# Patient Record
Sex: Female | Born: 1980 | Race: Black or African American | Hispanic: No | Marital: Married | Smoking: Former smoker
Health system: Southern US, Community
[De-identification: ages and names within clinical notes are randomized; demographics above are authoritative.]

## PROBLEM LIST (undated history)

## (undated) DIAGNOSIS — F329 Major depressive disorder, single episode, unspecified: Secondary | ICD-10-CM

## (undated) DIAGNOSIS — F419 Anxiety disorder, unspecified: Secondary | ICD-10-CM

## (undated) DIAGNOSIS — F32A Depression, unspecified: Secondary | ICD-10-CM

## (undated) DIAGNOSIS — G43909 Migraine, unspecified, not intractable, without status migrainosus: Secondary | ICD-10-CM

## (undated) HISTORY — PX: ENDOMETRIAL ABLATION: SHX621

## (undated) HISTORY — DX: Migraine, unspecified, not intractable, without status migrainosus: G43.909

## (undated) HISTORY — DX: Depression, unspecified: F32.A

## (undated) HISTORY — DX: Major depressive disorder, single episode, unspecified: F32.9

## (undated) HISTORY — DX: Anxiety disorder, unspecified: F41.9

---
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
## 2000-09-06 ENCOUNTER — Encounter: Payer: Self-pay | Admitting: Emergency Medicine

## 2000-09-06 ENCOUNTER — Emergency Department (HOSPITAL_COMMUNITY): Admission: EM | Admit: 2000-09-06 | Discharge: 2000-09-06 | Payer: Self-pay | Admitting: *Deleted

## 2000-09-06 ENCOUNTER — Encounter: Payer: Self-pay | Admitting: *Deleted

## 2000-09-30 ENCOUNTER — Ambulatory Visit (HOSPITAL_COMMUNITY): Admission: RE | Admit: 2000-09-30 | Discharge: 2000-09-30 | Payer: Self-pay | Admitting: Family Medicine

## 2000-09-30 ENCOUNTER — Encounter: Payer: Self-pay | Admitting: Family Medicine

## 2000-12-22 ENCOUNTER — Emergency Department (HOSPITAL_COMMUNITY): Admission: EM | Admit: 2000-12-22 | Discharge: 2000-12-22 | Payer: Self-pay | Admitting: Emergency Medicine

## 2001-07-15 ENCOUNTER — Inpatient Hospital Stay (HOSPITAL_COMMUNITY): Admission: AD | Admit: 2001-07-15 | Discharge: 2001-07-15 | Payer: Self-pay | Admitting: Obstetrics and Gynecology

## 2006-11-06 ENCOUNTER — Ambulatory Visit: Payer: Self-pay | Admitting: Obstetrics & Gynecology

## 2006-11-09 ENCOUNTER — Ambulatory Visit (HOSPITAL_COMMUNITY): Admission: RE | Admit: 2006-11-09 | Discharge: 2006-11-09 | Payer: Self-pay | Admitting: Family Medicine

## 2006-11-20 ENCOUNTER — Ambulatory Visit: Payer: Self-pay | Admitting: Obstetrics & Gynecology

## 2006-12-16 ENCOUNTER — Other Ambulatory Visit: Admission: RE | Admit: 2006-12-16 | Discharge: 2006-12-16 | Payer: Self-pay | Admitting: Obstetrics & Gynecology

## 2006-12-16 ENCOUNTER — Ambulatory Visit: Payer: Self-pay | Admitting: Gynecology

## 2006-12-30 ENCOUNTER — Ambulatory Visit: Payer: Self-pay | Admitting: Obstetrics & Gynecology

## 2007-01-31 ENCOUNTER — Ambulatory Visit: Payer: Self-pay | Admitting: Family Medicine

## 2007-01-31 ENCOUNTER — Observation Stay (HOSPITAL_COMMUNITY): Admission: EM | Admit: 2007-01-31 | Discharge: 2007-02-02 | Payer: Self-pay | Admitting: Emergency Medicine

## 2007-02-02 ENCOUNTER — Encounter: Payer: Self-pay | Admitting: Family Medicine

## 2007-02-04 ENCOUNTER — Encounter: Payer: Self-pay | Admitting: *Deleted

## 2007-02-04 ENCOUNTER — Telehealth (INDEPENDENT_AMBULATORY_CARE_PROVIDER_SITE_OTHER): Payer: Self-pay | Admitting: *Deleted

## 2007-02-04 ENCOUNTER — Ambulatory Visit: Payer: Self-pay | Admitting: Obstetrics & Gynecology

## 2007-02-08 ENCOUNTER — Ambulatory Visit: Payer: Self-pay | Admitting: Gynecology

## 2007-02-23 ENCOUNTER — Ambulatory Visit: Payer: Self-pay | Admitting: Family Medicine

## 2007-02-23 ENCOUNTER — Encounter: Payer: Self-pay | Admitting: Family Medicine

## 2007-02-23 DIAGNOSIS — F329 Major depressive disorder, single episode, unspecified: Secondary | ICD-10-CM

## 2007-02-23 LAB — CONVERTED CEMR LAB
AST: 17 units/L (ref 0–37)
Basophils Absolute: 0 10*3/uL (ref 0.0–0.1)
CO2: 22 meq/L (ref 19–32)
Creatinine, Ser: 0.91 mg/dL (ref 0.40–1.20)
HCT: 41 % (ref 36.0–46.0)
Lymphocytes Relative: 29 % (ref 12–46)
MCV: 88.6 fL (ref 78.0–100.0)
Monocytes Absolute: 0.5 10*3/uL (ref 0.1–1.0)
Monocytes Relative: 7 % (ref 3–12)
Platelets: 279 10*3/uL (ref 150–400)
RDW: 14.8 % (ref 11.5–15.5)
Sodium: 141 meq/L (ref 135–145)
TSH: 0.721 microintl units/mL (ref 0.350–5.50)
Total Bilirubin: 1.9 mg/dL — ABNORMAL HIGH (ref 0.3–1.2)
Total Protein: 8 g/dL (ref 6.0–8.3)

## 2007-03-17 ENCOUNTER — Other Ambulatory Visit: Admission: RE | Admit: 2007-03-17 | Discharge: 2007-03-17 | Payer: Self-pay | Admitting: Obstetrics & Gynecology

## 2007-03-17 ENCOUNTER — Ambulatory Visit: Payer: Self-pay | Admitting: Obstetrics & Gynecology

## 2007-04-27 ENCOUNTER — Ambulatory Visit (HOSPITAL_COMMUNITY): Admission: RE | Admit: 2007-04-27 | Discharge: 2007-04-27 | Payer: Self-pay | Admitting: Obstetrics & Gynecology

## 2007-04-27 ENCOUNTER — Encounter: Payer: Self-pay | Admitting: Obstetrics & Gynecology

## 2007-04-27 ENCOUNTER — Ambulatory Visit: Payer: Self-pay | Admitting: Obstetrics & Gynecology

## 2007-05-12 ENCOUNTER — Ambulatory Visit: Payer: Self-pay | Admitting: Obstetrics & Gynecology

## 2007-06-18 ENCOUNTER — Telehealth: Payer: Self-pay | Admitting: Family Medicine

## 2007-06-18 ENCOUNTER — Telehealth (INDEPENDENT_AMBULATORY_CARE_PROVIDER_SITE_OTHER): Payer: Self-pay | Admitting: Family Medicine

## 2007-06-18 ENCOUNTER — Emergency Department (HOSPITAL_COMMUNITY): Admission: EM | Admit: 2007-06-18 | Discharge: 2007-06-18 | Payer: Self-pay | Admitting: Family Medicine

## 2007-08-19 ENCOUNTER — Encounter: Payer: Self-pay | Admitting: *Deleted

## 2007-08-31 ENCOUNTER — Other Ambulatory Visit: Admission: RE | Admit: 2007-08-31 | Discharge: 2007-08-31 | Payer: Self-pay | Admitting: Family Medicine

## 2007-08-31 ENCOUNTER — Encounter: Payer: Self-pay | Admitting: Family Medicine

## 2007-08-31 ENCOUNTER — Ambulatory Visit: Payer: Self-pay | Admitting: Family Medicine

## 2007-08-31 LAB — CONVERTED CEMR LAB
Bilirubin Urine: NEGATIVE
Specific Gravity, Urine: 1.025
Urobilinogen, UA: 1
WBC, UA: >20 cells/hpf

## 2007-09-03 ENCOUNTER — Telehealth: Payer: Self-pay | Admitting: Family Medicine

## 2007-09-07 ENCOUNTER — Encounter: Payer: Self-pay | Admitting: Family Medicine

## 2007-10-13 ENCOUNTER — Ambulatory Visit: Payer: Self-pay | Admitting: Family Medicine

## 2007-10-13 ENCOUNTER — Encounter: Payer: Self-pay | Admitting: *Deleted

## 2007-10-13 ENCOUNTER — Telehealth: Payer: Self-pay | Admitting: *Deleted

## 2007-10-13 LAB — CONVERTED CEMR LAB

## 2007-11-11 ENCOUNTER — Telehealth: Payer: Self-pay | Admitting: *Deleted

## 2008-04-06 ENCOUNTER — Ambulatory Visit: Payer: Self-pay | Admitting: Family Medicine

## 2008-04-06 LAB — CONVERTED CEMR LAB
Beta hcg, urine, semiquantitative: NEGATIVE
Bilirubin Urine: NEGATIVE
Blood in Urine, dipstick: NEGATIVE
Glucose, Urine, Semiquant: NEGATIVE
Ketones, urine, test strip: NEGATIVE
Specific Gravity, Urine: 1.02
WBC Urine, dipstick: NEGATIVE

## 2008-04-12 ENCOUNTER — Encounter: Payer: Self-pay | Admitting: *Deleted

## 2008-05-16 ENCOUNTER — Ambulatory Visit: Payer: Self-pay | Admitting: Family Medicine

## 2008-05-16 LAB — CONVERTED CEMR LAB
Glucose, Urine, Semiquant: NEGATIVE
Ketones, urine, test strip: NEGATIVE

## 2008-06-08 ENCOUNTER — Encounter: Payer: Self-pay | Admitting: Family Medicine

## 2008-06-29 ENCOUNTER — Ambulatory Visit: Payer: Self-pay | Admitting: Family Medicine

## 2008-06-29 ENCOUNTER — Telehealth: Payer: Self-pay | Admitting: Family Medicine

## 2008-07-13 ENCOUNTER — Encounter: Payer: Self-pay | Admitting: Obstetrics & Gynecology

## 2008-07-13 ENCOUNTER — Ambulatory Visit: Payer: Self-pay | Admitting: Obstetrics & Gynecology

## 2008-07-13 ENCOUNTER — Encounter: Payer: Self-pay | Admitting: Family Medicine

## 2008-07-13 LAB — CONVERTED CEMR LAB
HCT: 37.5 % (ref 36.0–46.0)
MCHC: 33.9 g/dL (ref 30.0–36.0)
MCV: 89.3 fL (ref 78.0–100.0)
Platelets: 223 10*3/uL (ref 150–400)
RBC: 4.2 M/uL (ref 3.87–5.11)
WBC: 11.8 10*3/uL — ABNORMAL HIGH (ref 4.0–10.5)

## 2008-07-24 ENCOUNTER — Ambulatory Visit: Payer: Self-pay | Admitting: Family Medicine

## 2008-07-24 ENCOUNTER — Encounter: Payer: Self-pay | Admitting: Family Medicine

## 2008-07-26 LAB — CONVERTED CEMR LAB: TSH: 0.614 microintl units/mL (ref 0.350–4.500)

## 2008-08-28 ENCOUNTER — Encounter: Payer: Self-pay | Admitting: Family Medicine

## 2008-12-29 ENCOUNTER — Ambulatory Visit: Payer: Self-pay | Admitting: Family Medicine

## 2008-12-29 ENCOUNTER — Encounter: Payer: Self-pay | Admitting: Family Medicine

## 2008-12-29 ENCOUNTER — Other Ambulatory Visit: Admission: RE | Admit: 2008-12-29 | Discharge: 2008-12-29 | Payer: Self-pay | Admitting: Family Medicine

## 2008-12-29 LAB — CONVERTED CEMR LAB
Beta hcg, urine, semiquantitative: NEGATIVE
Bilirubin Urine: NEGATIVE
Nitrite: NEGATIVE
Protein, U semiquant: NEGATIVE
Specific Gravity, Urine: 1.015
WBC Urine, dipstick: NEGATIVE

## 2009-01-01 ENCOUNTER — Encounter: Payer: Self-pay | Admitting: Family Medicine

## 2009-01-01 LAB — CONVERTED CEMR LAB
HCT: 41.3 % (ref 36.0–46.0)
MCHC: 32.4 g/dL (ref 30.0–36.0)
RDW: 14.2 % (ref 11.5–15.5)
WBC: 9.5 10*3/uL (ref 4.0–10.5)

## 2009-01-03 ENCOUNTER — Encounter: Payer: Self-pay | Admitting: Family Medicine

## 2009-05-03 ENCOUNTER — Encounter: Payer: Self-pay | Admitting: Sports Medicine

## 2009-05-03 ENCOUNTER — Ambulatory Visit: Payer: Self-pay | Admitting: Family Medicine

## 2009-05-03 ENCOUNTER — Telehealth: Payer: Self-pay | Admitting: Family Medicine

## 2009-05-06 ENCOUNTER — Emergency Department (HOSPITAL_COMMUNITY): Admission: EM | Admit: 2009-05-06 | Discharge: 2009-05-06 | Payer: Self-pay | Admitting: Family Medicine

## 2009-07-21 IMAGING — US US TRANSVAGINAL NON-OB
1 series · 13 of 25 positions shown · non-contrast
Comparison: none

CLINICAL DATA: Evaluate for fibroids.  LMP 10/21/06.  
 TRANSABDOMINAL AND TRANSVAGINAL PELVIC ULTRASOUND:
TECHNIQUE: Both transabdominal and transvaginal ultrasound examinations of the pelvis were performed including evaluation of the uterus, ovaries, adnexal regions, and pelvic cul-de-sac.

[Series 1: us transvaginal non-ob · 0.26mm/px · 13 of 45 slices shown]
[im 1/45]
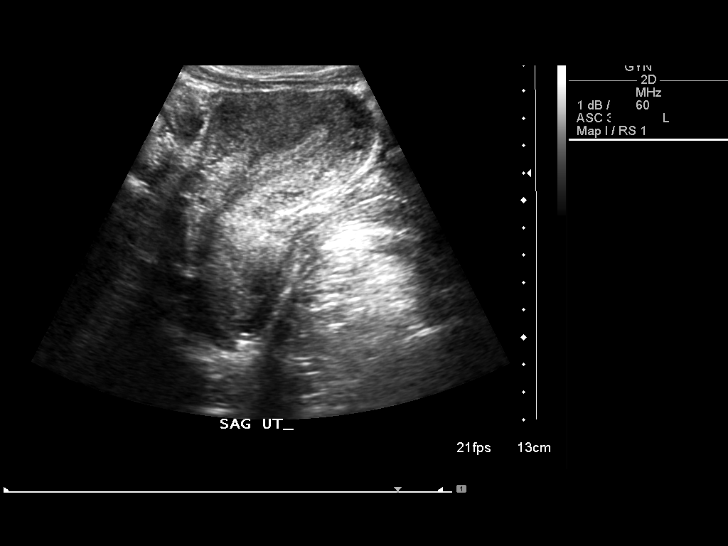
[im 4/45]
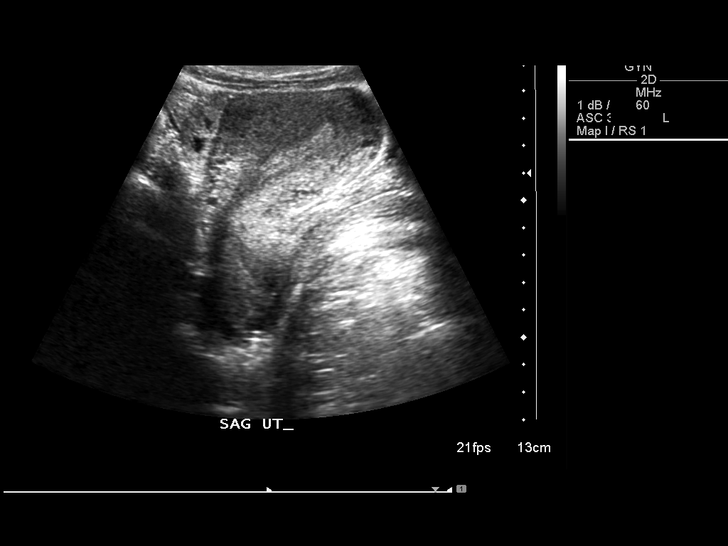
[im 8/45]
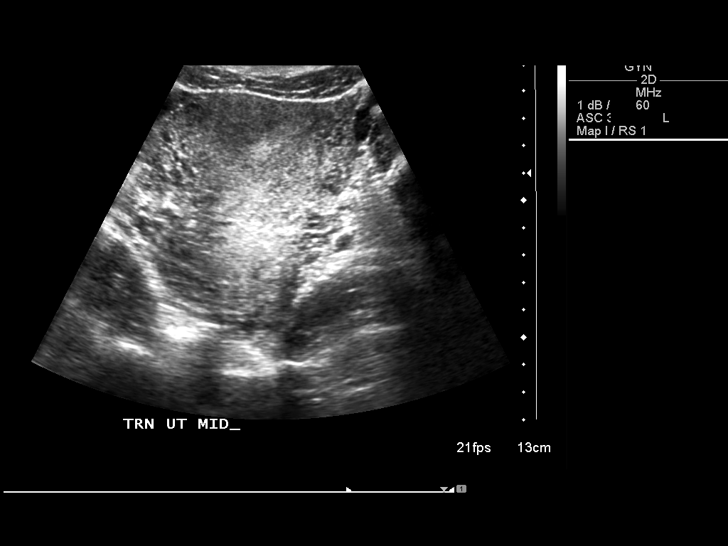
[im 12/45]
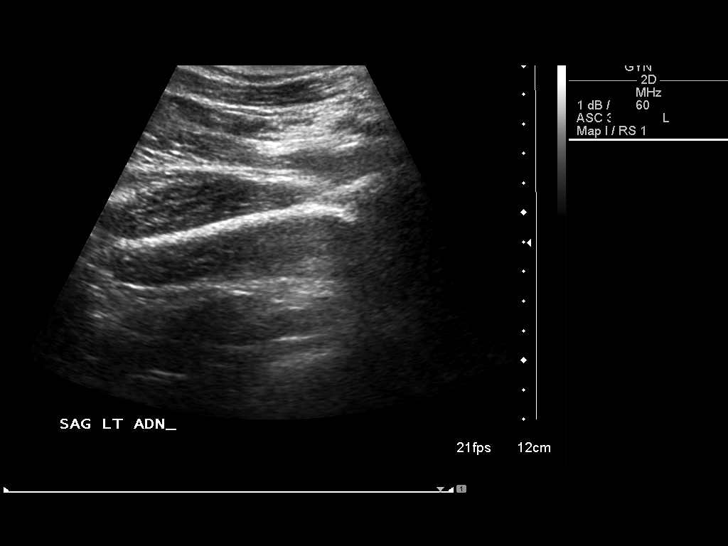
[im 15/45]
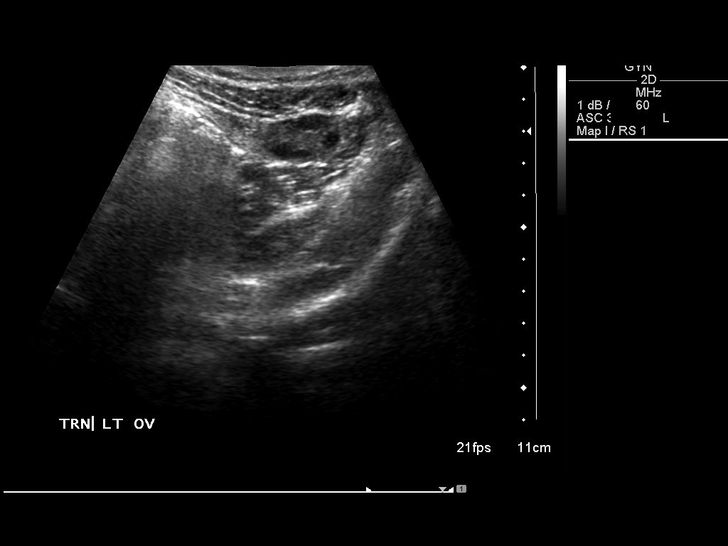
[im 19/45]
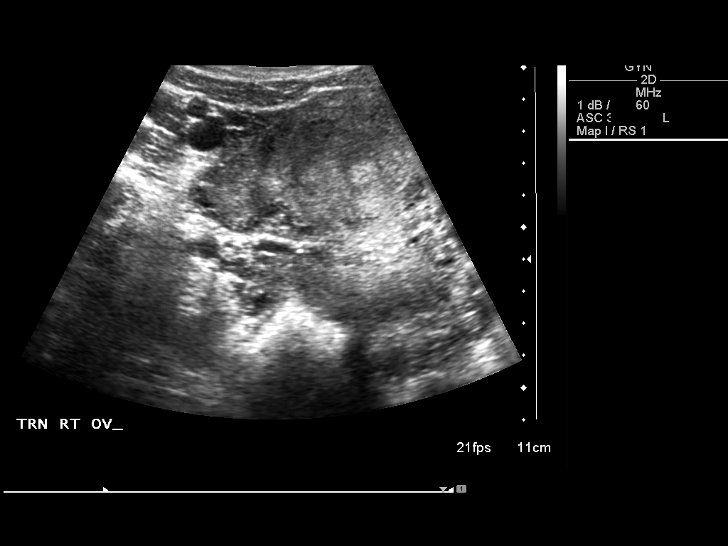
[im 23/45]
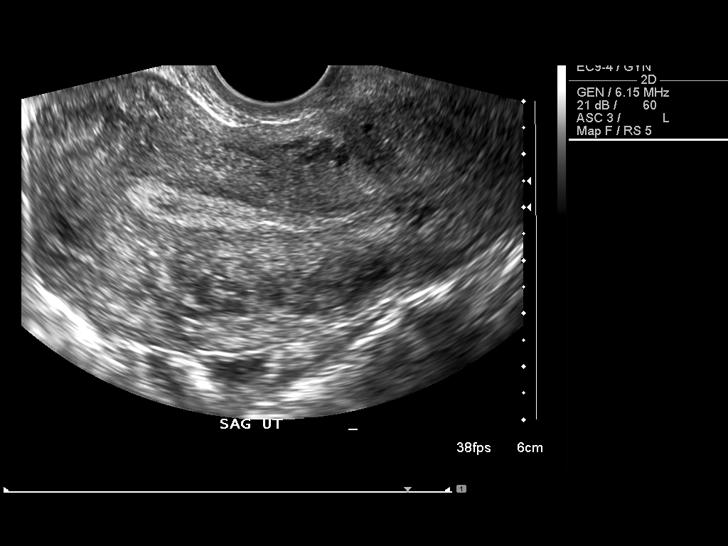
[im 26/45]
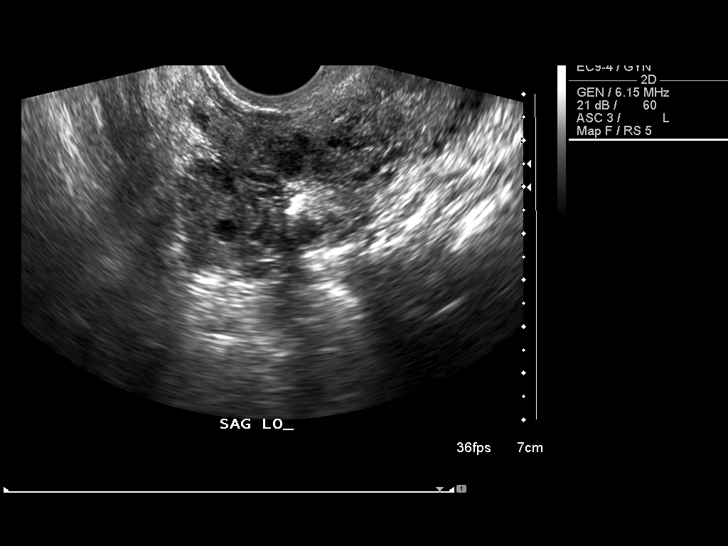
[im 30/45]
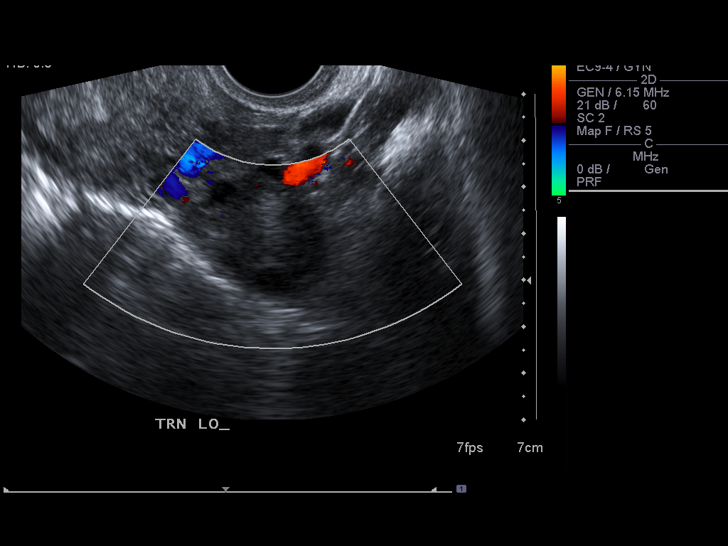
[im 34/45]
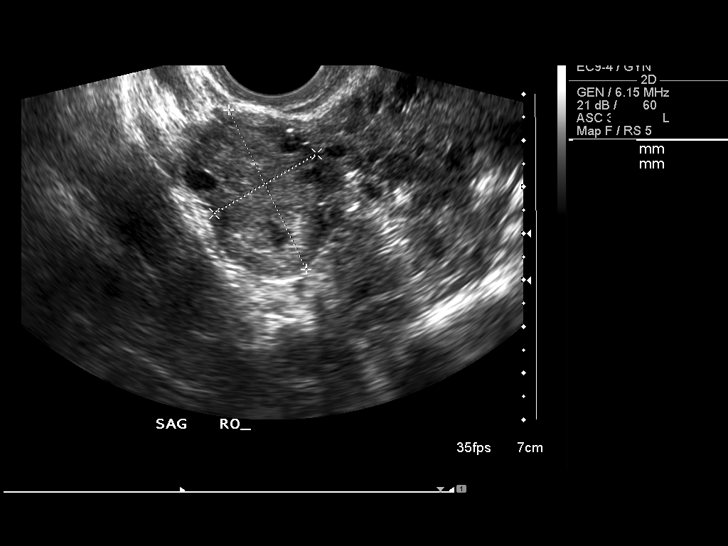
[im 37/45]
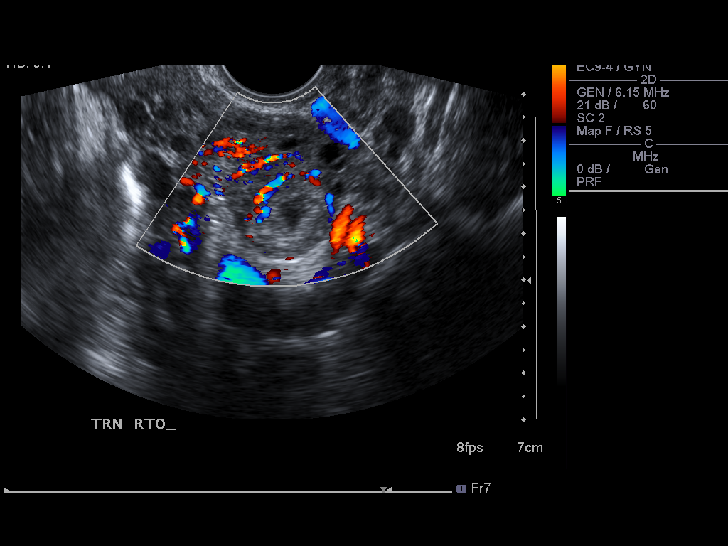
[im 41/45]
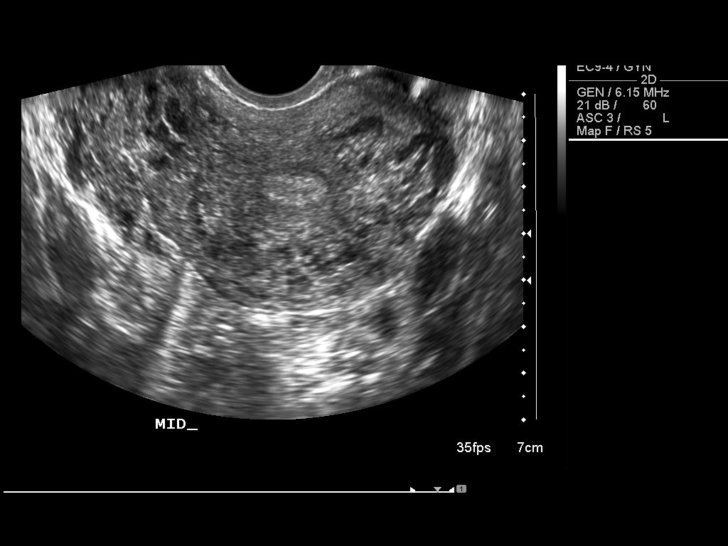
[im 45/45]
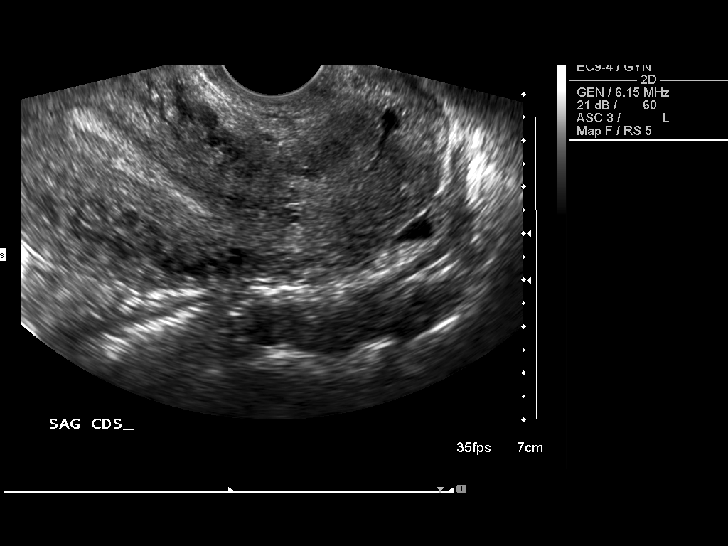

[13 of 25 positions shown; findings below may reference images not displayed]

FINDINGS: Multiple images of the uterus and adnexa were obtained using transabdominal and endovaginal approaches.  The uterus has a sagittal length of 11.6 cm and AP width of 5.3 cm and transverse width of 6.4 cm.  A homogeneous uterine myometrium is seen.  The endometrial stripe is tri-layered with an AP width of 6.3 mm and this would correlate with the periovulatory endometrial stripe and the patient?s given LMP of 10/21/06.  
 Both ovaries are seen with the left ovary  measuring 3.0 X 2.5 X 1.4 cm and the right ovary measuring 3.8 X 2.6 X 3.0 cm and containing a corpus luteum cyst.  A small amount of simple free fluid is identified in the cul-de-sac.  No adnexal masses are noted.
IMPRESSION: Normal periovulatory pelvic ultrasound.  The patient?s state a palpable abnormality was felt on physical exam.  If this persists, further evaluation with CT may be helpful to evaluate the nongynecologic structures of the pelvis.

## 2009-10-16 ENCOUNTER — Encounter: Payer: Self-pay | Admitting: *Deleted

## 2009-12-18 ENCOUNTER — Telehealth: Payer: Self-pay | Admitting: *Deleted

## 2009-12-19 ENCOUNTER — Encounter: Payer: Self-pay | Admitting: Family Medicine

## 2009-12-19 ENCOUNTER — Ambulatory Visit: Payer: Self-pay | Admitting: Family Medicine

## 2009-12-19 LAB — CONVERTED CEMR LAB
Hemoglobin: 13.2 g/dL (ref 12.0–15.0)
MCHC: 33 g/dL (ref 30.0–36.0)
Pap Smear: NEGATIVE
Platelets: 239 10*3/uL (ref 150–400)
RDW: 14 % (ref 11.5–15.5)
WBC: 10.4 10*3/uL (ref 4.0–10.5)

## 2009-12-21 ENCOUNTER — Telehealth: Payer: Self-pay | Admitting: *Deleted

## 2009-12-24 ENCOUNTER — Telehealth: Payer: Self-pay | Admitting: Family Medicine

## 2009-12-25 ENCOUNTER — Telehealth (INDEPENDENT_AMBULATORY_CARE_PROVIDER_SITE_OTHER): Payer: Self-pay | Admitting: Family Medicine

## 2010-01-31 ENCOUNTER — Encounter: Payer: Self-pay | Admitting: Family Medicine

## 2010-03-19 NOTE — Assessment & Plan Note (Signed)
Summary: boil?/Pearson/Linthavong   Vital Signs:  Patient profile:   30 year old female Height:      63 inches Weight:      134 pounds Temp:     98.0 degrees F oral Pulse rate:   85 / minute BP sitting:   112 / 80  (right arm) Cuff size:   regular  Vitals Entered By: Tessie Fass CMA (May 03, 2009 11:13 AM) CC: boil on lower back Is Patient Diabetic? No   Primary Care Provider:  Marisue Ivan, MD  CC:  boil on lower back.  History of Present Illness: 100F, boil on back for several days, tried to pop it, tried sitting in warm water.  Hurts and has been draining.  No fevers/chills.  Skin is very dry, tends to scratch back a lot.  Current Medications (verified): 1)  Zoloft 100 Mg Tabs (Sertraline Hcl) .... 1/2 Tablet By Mouth Daily For 1 Week and Then Stop 2)  Celexa 20 Mg Tabs (Citalopram Hydrobromide) .Marland Kitchen.. 1 Tab By Mouth Daily 3)  Detrol 1 Mg Tabs (Tolterodine Tartrate) .Marland Kitchen.. 1 Tab By Mouth Two Times A Day 4)  Doxycycline Hyclate 100 Mg Caps (Doxycycline Hyclate) .... One By Mouth Two Times A Day X 7 Days 5)  Tramadol Hcl 50 Mg Tabs (Tramadol Hcl) .... One Tab By Mouth Q6h As Needed For Pain  Allergies (verified): No Known Drug Allergies  Review of Systems       See HPI  Physical Exam  General:  Well-developed,well-nourished,in no acute distress; alert,appropriate and cooperative throughout examination Skin:  On back, 2 inches above gluteal crease, crusted, draining 2x2 cm boil.  Fluctuant, painful. Additional Exam:  I&D abscess  Consent obtained, time out conducted.  Area cleansed with alcohol, 10cc of lidocaine 1% with epi used to create a field block around abscess. Area further cleaned with chlorhexidine, draped in a sterile fashion.   Sterile gloves donned, Incision made approx 1.5 inches laterally across abscess with a #11 blade, immediate pus expressed.  Hemostat used to explore 4 quadrants, further pus expressed with minimal bleeding.  Pocket then packed with  approx 12" of 1/4" iodoform.  1 inch tail left.  Husband instructed on how to remove packing.  Dressings applied.   EBL minimal.  Pt stable.  Feels much better.   Impression & Recommendations:  Problem # 1:  BACK ABSCESS (ICD-682.2) Assessment New I&Ded, Doxy for 7 days as there were several other early abscesses present on her back and thigh that were not ready for I&D.  Handout and red flags given.  Tramadol for pain.  Her updated medication list for this problem includes:    Doxycycline Hyclate 100 Mg Caps (Doxycycline hyclate) ..... One by mouth two times a day x 7 days  Orders: [facility]- Est Level  3 ([address]782) I&D Abcess, simple- [facility] (10060)  Complete Medication List: 1)  Zoloft 100 Mg Tabs (Sertraline hcl) .... 1/2 tablet by mouth daily for 1 week and then stop 2)  Celexa 20 Mg Tabs (Citalopram hydrobromide) .Marland Kitchen.. 1 tab by mouth daily 3)  Detrol 1 Mg Tabs (Tolterodine tartrate) .Marland Kitchen.. 1 tab by mouth two times a day 4)  Doxycycline Hyclate 100 Mg Caps (Doxycycline hyclate) .... One by mouth two times a day x 7 days 5)  Tramadol Hcl 50 Mg Tabs (Tramadol hcl) .... One tab by mouth q6h as needed for pain  Patient Instructions: 1)  Pull out 6 inches of the packing material in 2 days, then pull  out hte rest 2 days later. 2)  Take the full course of doxycycline. 3)  Ultram for pain. 4)  -Dr. Karie Schwalbe. Prescriptions: TRAMADOL HCL 50 MG TABS (TRAMADOL HCL) One tab by mouth q6h as needed for pain  #40 x 0   Entered and Authorized by:   Rodney Langton MD   Signed by:   Rodney Langton MD on 05/03/2009   Method used:   Print then Give to Patient   RxID:   [id number] DOXYCYCLINE HYCLATE 100 MG CAPS (DOXYCYCLINE HYCLATE) One by mouth two times a day x 7 days  #14 x 0   Entered and Authorized by:   Rodney Langton MD   Signed by:   Rodney Langton MD on 05/03/2009   Method used:   Print then Give to Patient   RxID:   [id number]

## 2010-03-19 NOTE — Letter (Signed)
Summary: Handout Printed  Printed Handout:  - Incision and Drainage of Abscess 

## 2010-03-19 NOTE — Progress Notes (Signed)
Summary: WI request  Phone Note Call from Patient Call back at [phone]   Reason for Call: Talk to Nurse Summary of Call: requesting wi appt, has an infected bump on lower back Initial call taken by: Knox Royalty,  May 03, 2009 9:06 AM  Follow-up for Phone Call        it has been present x 1 wk. has applied hot compress .located "at bottom of my spine" painful when she applies pressure to it. relates hx of boils. to come in now  MD: plz discuss DNKAs  to Dennison Nancy, RN, Chiropodist. hx of multiple DNKAs Follow-up by: Golden Circle RN,  May 03, 2009 9:12 AM  Additional Follow-up for Phone Call Additional follow up Details #1::        pt seen in office by Dr. Karie Schwalbe, agree with plan Additional Follow-up by: Marisue Ivan  MD,  May 04, 2009 12:34 PM

## 2010-03-19 NOTE — Progress Notes (Signed)
Summary: Triage call  Phone Note Call from Patient   Caller: Patient Call For: [phone] Summary of Call: Pt had an ablation last year and was told she will no longer have a menstrual cycle.  Also had a tubal ligation in 05.  Pt have been experiencing vaginal bleeding x 4 days.  Is concerned.  Would like to have someone see her today.   Pt also advised to see Rudell Cobb for financial assistance.  Was instructed to not have anymore dnkas' since she received probation letter.  Pt understood and agreed to seek help from [employer]. Initial call taken by: Abundio Miu,  December 18, 2009 2:15 PM  Follow-up for Phone Call        pt returned call Follow-up by: De Nurse,  December 18, 2009 2:49 PM  Additional Follow-up for Phone Call Additional follow up Details #1::        Spoke with Dr Clotilde Dieter and Sheffield Slider.  They advised that pt be seen especially since she had no bleeding for almost a year and it now having it.  [place] appt made for tomorrow am. Additional Follow-up by: Dennison Nancy RN,  December 18, 2009 3:04 PM

## 2010-03-19 NOTE — Progress Notes (Signed)
  Phone Note Outgoing Call   Call placed by: Jimmy Footman, CMA,  December 25, 2009 10:32 AM Call placed to: Patient Summary of Call: LVM for pt to call back to inform of gyn appt. Info is on orders.  Follow-up for Phone Call        Spoke with and informed pt of gyn appt Follow-up by: Jimmy Footman, CMA,  December 25, 2009 3:59 PM

## 2010-03-19 NOTE — Assessment & Plan Note (Signed)
Summary: Vag bleeding/kf   Vital Signs:  Patient profile:   30 year old female Height:      63 inches Weight:      129 pounds BMI:     22.93 Temp:     97.5 degrees F oral Pulse rate:   69 / minute BP sitting:   108 / 74  (left arm) Cuff size:   regular  Vitals Entered By: Jimmy Footman, CMA (December 19, 2009 10:47 AM) CC: vaginal bleeding x4 days, started cramping last night, heavy to light bleeding Is Patient Diabetic? No Pain Assessment Patient in pain? yes     Location: vaginal cramping Type: aching   Primary Care Provider:  Jamie Brookes MD  CC:  vaginal bleeding x4 days, started cramping last night, and heavy to light bleeding.  History of Present Illness: pt s/p endometrial ablation for dysfunctional uterine bleeding approx 2 years ago. Has noticed abnormal bleeding for last 3-4 months. 1- 2episodes of bleeding per month. lasting 2-3 days-going through 5-6 pads  per day per pt. No NSAID, ASA use per pt. No vaginal dishcarge, recent irregular sexual activity. Minimal abd pain per pt. Pt unsure of pelvic imaging in the past.   Habits & Providers  Alcohol-Tobacco-Diet     Tobacco Status: never  Allergies: No Known Drug Allergies  Physical Exam  General:  alert and well-developed.   Neck:  supple and full ROM.   Lungs:  CTAB Heart:  RRR Abdomen:  + bowel sounds, minimal tenderness to palpation, no cervical motion tenderness Genitalia:  normal introitus and no external lesions.  + Blood in vagina and cervical os.    Impression & Recommendations:  Problem # 1:  ABNORMAL VAGINAL BLEEDING (ICD-626.9) Plan to re-refer to gyn for re-evaluation s/p endometrial ablation. Informed pt that endometrial ablation is not 100% effective in completely resolving dysfunctional uterine bleeding. Will rx provera in interim.  Will also obtain pap/cervical/vaginal cultures given hx/o STDs in past per pt as well as pt being due for annual screening. . Will also check CBC to rule out  anemia given bleeding history.  Her updated medication list for this problem includes:    Provera 10 Mg Tabs (Medroxyprogesterone acetate) .Marland Kitchen... 1 tablet daily for 10 days  Orders: U Preg-[facility] ([address]25) CBC-[facility] ([address]) [facility]- Est Level  3 (60454)  Complete Medication List: 1)  Zoloft 100 Mg Tabs (Sertraline hcl) .... 1/2 tablet by mouth daily for 1 week and then stop 2)  Celexa 20 Mg Tabs (Citalopram hydrobromide) .Marland Kitchen.. 1 tab by mouth daily 3)  Detrol 1 Mg Tabs (Tolterodine tartrate) .Marland Kitchen.. 1 tab by mouth two times a day 4)  Doxycycline Hyclate 100 Mg Caps (Doxycycline hyclate) .... One by mouth two times a day x 7 days 5)  Tramadol Hcl 50 Mg Tabs (Tramadol hcl) .... One tab by mouth q6h as needed for pain 6)  Provera 10 Mg Tabs (Medroxyprogesterone acetate) .Marland Kitchen.. 1 tablet daily for 10 days  [facility]: [phone]) GC/Chlamydia-[facility] (87591/87491) Pap Smear-[facility] ([id number])  Patient Instructions: 1)  It was good to see you today 2)  I am going to check some cultures today to evaluate your menstrual bleeding and pelvic pain  3)  I will also check some blood work to make sure that you are not anemic 4)  I am puttin gyou on provera for your menstrual bleeding 5)  I am also re-refering you back to gynecology  6)  If you develop any severe menstrual bleeding ,  abdominal pain, fever or any other symptoms, please give [place] a call or go to the ED for evaluation.  7)  Otherwise call for any questions 8)  God Bless,  9)  Doree Albee MD  Prescriptions: PROVERA 10 MG TABS (MEDROXYPROGESTERONE ACETATE) 1 tablet daily for 10 days  #10 x 0   Entered and Authorized by:   Doree Albee MD   Signed by:   Doree Albee MD on 12/20/2009   Method used:   Electronically to        [facility] [address] [phone]* (retail)       [address]       [place], [place]  [address]       Ph: [phone]       Fax: [phone]   RxID:   [id number]    Orders Added: 1)  U Preg-[facility] [81025] 2)  Wet  Prep- [facility] [87210] 3)  GC/Chlamydia-[facility] [87591/87491] 4)  CBC-[facility] [85027] 5)  Pap Smear-[facility] [[id number]] 6)  [facility]- Est Level  3 [17510]     Laboratory Results   Urine Tests  Date/Time Received: December 19, 2009 11:25 AM  Date/Time Reported: December 19, 2009 11:36 AM     Urine HCG: negative Comments: ...............test performed by......Marland KitchenBonnie A. [place], MLS (ASCP)cm  Date/Time Received: December 19, 2009 12:02 PM  Date/Time Reported: December 19, 2009 12:14 PM   Wet [place] Source: vag WBC/hpf: 0-2 Bacteria/hpf: 3+  Cocci Clue cells/hpf: moderate Yeast/hpf: none Trichomonas/hpf: none Comments: ...............test performed by......Marland KitchenBonnie A. [place], MLS (ASCP)cm     Appended Document: Vag bleeding/kf Called and discussed wet prep results w/ pt (+BV), as well as blood count. Will prescribe flagyl. Pt agreeable to plan.    Clinical Lists Changes  Medications: Added new medication of FLAGYL 500 MG TABS (METRONIDAZOLE) 1 tab by mouth two times a day for 7 days - Signed Rx of FLAGYL 500 MG TABS (METRONIDAZOLE) 1 tab by mouth two times a day for 7 days;  #14 x 0;  Signed;  Entered by: Doree Albee MD;  Authorized by: Doree Albee MD;  Method used: Electronically to [facility] [address] [phone]*, [address], [place], [place]  [address], Ph: [phone], Fax: [phone]    Prescriptions: FLAGYL 500 MG TABS (METRONIDAZOLE) 1 tab by mouth two times a day for 7 days  #14 x 0   Entered and Authorized by:   Doree Albee MD   Signed by:   Doree Albee MD on 12/20/2009   Method used:   Electronically to        [facility] [address] [phone]* (retail)       [address]       [place], [place]  [address]       Ph: [phone]       Fax: [phone]   RxID:   [id number]

## 2010-03-19 NOTE — Letter (Signed)
Summary: Probation Letter  [facility]  [address]   [place], [place] [address]   Phone: [phone]  Fax: [phone]    10/16/2009  Julie Hawkins [address] [place], [place]  [address]  Dear Ms. [facility],  With the goal of better serving all our patients the [facility] is following each patient's missed appointments.  You have missed at least 3 appointments with our practice.If you cannot keep your appointment, we expect you to call at least 24 hours before your appointment time.  Missing appointments prevents other patients from seeing [place] and makes it difficult to provide you with the best possible medical care.      1.   If you miss one more appointment, we will only give you limited medical services. This means we will not call in medication refills, complete a form, or make a referral for you except when you are here for a scheduled office visit.    2.   If you miss 2 or more appointments in the next year, we will dismiss you from our practice.    Our office staff can be reached at [phone] Monday through Friday from 8:30 a.m.-5:00 p.m. and will be glad to schedule your appointment as necessary.    Thank you.   [facility]  Appended Document: Probation Letter mailed

## 2010-03-19 NOTE — Progress Notes (Signed)
  Phone Note Outgoing Call   Call placed by: Jimmy Footman, CMA,  December 24, 2009 9:01 AM Call placed to: Patient Summary of Call: spoke with pt to give pap results. pt states that she was put on celexa and was wondering if she could get her rx refilled without having to come in due to her not having insurance. I explained to her that because it is antidepressive med she will have to probably come in to be seen. I also gave her info for [facility] again. Would she have to come in for visit before rx filled? Initial call taken by: Jimmy Footman, CMA,  December 24, 2009 9:03 AM  Follow-up for Phone Call        I would like her to come in. I dont' know how long she's been out, what her symptoms are like, is she on Zoloft or Celexa (both are on her med rec), and we need a current PHQ9 on her. It looks like it's been about 1 year since she was precribed this medication and she only had 1 refill so this is going to be like starting over again.  Follow-up by: Jamie Brookes MD,  December 24, 2009 11:14 AM  Additional Follow-up for Phone Call Additional follow up Details #1::        Spoke with pt and informed that if she is wanting to start meds again she will have to make appt. she agreed on calling  and making an appt Additional Follow-up by: Jimmy Footman, CMA,  December 24, 2009 11:34 AM

## 2010-03-19 NOTE — Miscellaneous (Signed)
  Clinical Lists Changes  Orders: Added new Referral order of Gynecologic Referral (Gyn) - Signed [phone] - Signed Added new Test order of GC/Chlamydia-[facility] (87591/87491) - Signed

## 2010-03-19 NOTE — Progress Notes (Signed)
  Phone Note Outgoing Call   Call placed by: Jimmy Footman, CMA,  December 21, 2009 10:46 AM Call placed to: Patient Summary of Call: LVM for pt to call back to inform of referral. [facility] and [facility] of [facility]t  Follow-up for Phone Call        spokw with patient and informed of [facility]. Pt also has an appt with Rudell Cobb for assistance prior to getting gyn done Follow-up by: Jimmy Footman, CMA,  December 21, 2009 3:02 PM

## 2010-03-20 ENCOUNTER — Encounter: Payer: Self-pay | Admitting: *Deleted

## 2010-03-21 NOTE — Miscellaneous (Signed)
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 Hospitalization 01/31/07-02/02/07 for attempted acetaminophen overdose 2. h/o HSV 2 2009- treated    (01/31/2010 9:36)       Past History:  Past Medical History: 1. Hospitalization 01/31/07-02/02/07 for attempted acetaminophen overdose 2. h/o HSV 2 2009- treated

## 2010-05-28 LAB — POCT URINALYSIS DIP (DEVICE)
Bilirubin Urine: NEGATIVE
Glucose, UA: NEGATIVE mg/dL
Nitrite: NEGATIVE
Urobilinogen, UA: 0.2 mg/dL (ref 0.0–1.0)

## 2010-06-28 ENCOUNTER — Ambulatory Visit (INDEPENDENT_AMBULATORY_CARE_PROVIDER_SITE_OTHER): Payer: Medicaid Other | Admitting: Family Medicine

## 2010-06-28 VITALS — BP 120/83 | HR 75 | Temp 97.6°F | Ht 63.5 in | Wt 136.4 lb

## 2010-06-28 DIAGNOSIS — N920 Excessive and frequent menstruation with regular cycle: Secondary | ICD-10-CM

## 2010-06-28 DIAGNOSIS — F329 Major depressive disorder, single episode, unspecified: Secondary | ICD-10-CM

## 2010-06-28 DIAGNOSIS — N898 Other specified noninflammatory disorders of vagina: Secondary | ICD-10-CM

## 2010-06-28 DIAGNOSIS — Z309 Encounter for contraceptive management, unspecified: Secondary | ICD-10-CM

## 2010-06-28 DIAGNOSIS — N939 Abnormal uterine and vaginal bleeding, unspecified: Secondary | ICD-10-CM | POA: Insufficient documentation

## 2010-06-28 MED ORDER — MEDROXYPROGESTERONE ACETATE 150 MG/ML IM SUSP
150.0000 mg | Freq: Once | INTRAMUSCULAR | Status: AC
  Administered 2010-06-28: 150 mg via INTRAMUSCULAR

## 2010-06-28 MED ORDER — TOLTERODINE TARTRATE 1 MG PO TABS: 1.0000 mg | ORAL_TABLET | Freq: Two times a day (BID) | ORAL | Status: DC

## 2010-06-28 MED ORDER — MEDROXYPROGESTERONE ACETATE 150 MG/ML IM SUSP: 150.0000 mg | INTRAMUSCULAR | Status: DC

## 2010-06-28 MED ORDER — CITALOPRAM HYDROBROMIDE 20 MG PO TABS: 40.0000 mg | ORAL_TABLET | Freq: Every day | ORAL | Status: DC

## 2010-06-28 NOTE — Progress Notes (Signed)
Vaginal Bleeding; Pt has had an ablation in the past and thought she was done with vaginal bleeding. She was offered Depo shots in the past and declined them but now wants to go ahead and get them to stop this bleeding. She is spending 20 dollars a month in pads.   Depression/Anxiety: Pt says that she use to be on a medication for anxiety and depression. She has been off her meds lately and would like to restart them. She is asking for Xanax here today but we discussed restarting her Celexa to treat both of them at the same time. She agrees.  ROS: neg except for vaginal bleeding.   PE:  Gen: NAD, sitting comfrotrtably.  CV: rrr, no murmur Pulm: CTAB, no wheezing.  Psych: Pt does appear calm and has good eye contact, slightly flat affect.

## 2010-06-28 NOTE — Patient Instructions (Signed)
You are filling out an anxiety and depression questionair today and should fill them out again in 4-6 weeks to see if things are getting better.  You need to be seen in about 4 weeks so we can talk about how the Celexa is helping with the anxiety and depression.   You are starting Depo today. You need to come back every 3 months for a nurse visit for depo shots. This should help with the bleeding.

## 2010-06-28 NOTE — Assessment & Plan Note (Addendum)
Pt has had ablation and now is starting to have bleeding and spotting throughout the month.  Upreg negative.  Started on Depo shots today per Pt request.

## 2010-06-28 NOTE — Assessment & Plan Note (Signed)
Pt continues to suffer with anxeity and depression.  PHQ9 score is 18 and "very difficult"  GAD7 score is 18 and "very difficult" putting her in the very severe category.  Pt restarted on Celexa today.

## 2010-07-02 ENCOUNTER — Telehealth: Payer: Self-pay | Admitting: *Deleted

## 2010-07-02 ENCOUNTER — Other Ambulatory Visit: Payer: Self-pay | Admitting: Family Medicine

## 2010-07-02 MED ORDER — SOLIFENACIN SUCCINATE 5 MG PO TABS: 5.0000 mg | ORAL_TABLET | Freq: Every day | ORAL | Status: DC

## 2010-07-02 NOTE — Group Therapy Note (Signed)
Julie Hawkins, BROERMAN NO.:  [id number]   MEDICAL RECORD NO.:  [id number]          PATIENT TYPE:  WOC   LOCATION:  [facility]                   FACILITY:  [facility]   PHYSICIAN:  Elsie Lincoln, MD      DATE OF BIRTH:  01-Jan-1981   DATE OF SERVICE:  07/13/2008                                  CLINIC NOTE   HISTORY:  The patient is a 30 year old female who is status post  endometrial ablation in March 2009.  She was amenorrhea for a while, but  she started to have her period again and less concerned.  I explained to  her that approximately 50% of the people are amenorrheic, but most more  like 85% are happy with their bleeding profile.  The patient only  changes pads 1-2 times a day.  It sounds like her periods are coming  once a month.  She just started bleeding today, and her period before  that was last month.  We will do a CBC today just to make sure that I am  not missing menorrhagia that does not appear to be heavy.  We also do a  urine pregnancy test.  Also of note, the patient has had cervical  dysplasia and underwent cryo.  I do not believe she has had adequate Pap  followup, so we will do a Pap smear today.  Also of note, during the  [place] required depression screen, she said that she was depressed and  sad.  She did not feel like life was worth living and sometimes she  wished she was dead.  She did not feel like ending her life.  I asked  her more in-depth questions about her depression, she said this is her  baseline, she does not feel like she wants to hurt herself or hurt  anyone else.  She appears appropriate today.   PAST FAMILY HISTORY:  The patient's mother was questionably newly  diagnosed with breast cancer.  The patient is going to ask her what kind  of cancer exactly her mother has, and if it is breast cancer, then the  mother should be tested for BRCA 1and 2.   PHYSICAL EXAMINATION:  GENERAL:  Well nourished, well developed, in no  apparent  distress.  HEENT:  Normocephalic and atraumatic.  CHEST:  Normal movement.  ABDOMEN:  Soft, nontender.  No organomegaly.  No hernia.  VITAL SIGNS:  Temperature 96.7, pulse 85, blood pressure 103/74, weight  136.7, and height 64 inches.  GENITALIA:  Tanner V.  Vagina, pink, normal rugae.  Bladder and urethra  nontender.  Cervix was closed.  Uterus anteverted, nontender, adnexa no  masses, nontender.  EXTREMITIES:  No edema.   ASSESSMENT AND PLAN:  A 30 year old female who is most likely have her  period.  1. The patient reassured that bleeding after an ablation can be      normal.  2. Urine pregnancy test.  3. CBC.  4. Pap smear for followup dysplasia.  5. No suicidal ideation today.  6. Return to clinic in 2-3 weeks.           ______________________________  Elsie Lincoln,  MD     KL/MEDQ  D:  07/13/2008  T:  07/14/2008  Job:  [id number]

## 2010-07-02 NOTE — Group Therapy Note (Signed)
NAMEMarland Kitchen  ATHA, MURADYAN NO.:  [id number]   MEDICAL RECORD NO.:  [id number]          PATIENT TYPE:  WOC   LOCATION:  [facility]                   FACILITY:  [facility]   PHYSICIAN:  Johnella Moloney, MD        DATE OF BIRTH:  [birth year]   DATE OF SERVICE:  03/17/2007                                  CLINIC NOTE   HISTORY OF PRESENT ILLNESS:  The patient is a 30 year old G2, P2 who is  here to follow her follow up of two reasons.  1. Cervical dysplasia.  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 After that she was seen in clinic and offered      cryotherapy versus LEEP. Patient preferred cryotherapy.  She missed      her subsequent appointment for this cryotherapy. Also the patient      is here today to have that cryotherapy done.  2. Her second complaint is of her menometrorrhagia.  The patient has      had a long history of menometrorrhagia for several years.  She has      used OCPs and Depo-Provera in the past and this has not alleviated      her symptoms. The patient is now requesting surgical evaluation and      management of this pain.  Her last ultrasound was in September      2008, which showed a normal pelvic ultrasound with no structural      anomalies seen and normal adnexa. The patient wants to discuss      further management of this problem.  The patient has no other      complaints.   REVIEW OF SYSTEMS:  A 14 point review of systems was done, but was  negative.   PAST MEDICAL HISTORY:  None.   PAST SURGICAL HISTORY:  Bilateral tubal ligation 3 years ago.   MEDICATIONS:  None.   ALLERGIES:  No known drug allergies.   PAST OB/GYN HISTORY:  The patient has a history of two vaginal  deliveries. She has a history of cervical dysplasia as noted and denies  any sexually transmitted infections.   SOCIAL HISTORY:  The patient does not smoke, use any drugs or drink any  alcohol.  She does not have any history of abuse.   FAMILY HISTORY:  There is a history of diabetes in her grandmother and  father and grandparents on both sides have heart disease. Her  grandmother also has high blood pressure.   PHYSICAL EXAMINATION:  Temperature 97.5, pulse 90, blood pressure  105/73.  Weight is 118.5 pounds, height is 63 inches.  GENERAL:  No apparent distress.  ABDOMEN:  Soft, nontender, nondistended.  PELVIC EXAM:  Normal external female genitalia with moderate amount of  blood noted in her vaginal with clots. Slow trickle blood noted from  cervical os.  No  uterine or cervical motion tenderness.  The patient  was noted to have a 10-week size introverted uterus.   PROCEDURES:  The patient was  counseled regarding the need for  endometrial biopsy given her persistent menometrorrhagia in addition to  her cryotherapy.  Her risks of the procedures were explained to the  patient and written informed consent was obtained. The patient's cervix  was swabbed with Betadine and was sounded to 10 cm.  A 3 mm Pipelle was  introduced to the patient's uterine cavity and a moderate amount of  tissue was obtained and sent to pathology. After this, her cervix was  cleansed and a cryotherapy probe was applied to this transformation  zone.  There were two cycles of cryotherapy 3 minutes each with a 2-  minute intervening rest in between cycles. Good ablation of  transformation zone was noted.  The patient tolerated both procedures  well.  She was told to take Motrin as needed for discomfort and a script  for ibuprofen was given to the patient.  She will follow up in 2 weeks  for results of the endometrial biopsy and also for further discussion.   ASSESSMENT/PLAN:  The patient is a 30 year old G2. P2 here for two  reasons.  1. Cervical dysplasia.  The patient is now status post cryotherapy.      She will need a follow-up Pap smear in 4-6 months. The patient was      told to expect  some discharge for several weeks which is normal      after cryotherapy.  2. Menometrorrhagia.  The patient has had persistent menometrorrhagia      for several months and desires surgical evaluation.  She is now      status post an endometrial biopsy. If this comes back negative, the      patient is interested in NovaSure endometrial ablation.  She does      understand that if the ablation does not alleviate her symptoms      that we will proceed with a hysterectomy. Patient is agreeable to      this plan.  She will be scheduled for an NovaSure endometrial      ablation and will be called with time and date of surgery.           ______________________________  Johnella Moloney, MD     UD/MEDQ  D:  03/17/2007  T:  03/17/2007  Job:  [id number]

## 2010-07-02 NOTE — Discharge Summary (Signed)
NAMEARDYCE, HEYER           ACCOUNT NO.:  [id number]   MEDICAL RECORD NO.:  [id number]          PATIENT TYPE:  WOC   LOCATION:  [facility]                          FACILITY:  [facility]   PHYSICIAN:  Marisue Ivan, MD  DATE OF BIRTH:  May 20, 1980   DATE OF ADMISSION:  01/31/2007  DATE OF DISCHARGE:  02/02/2007                               DISCHARGE SUMMARY   PRIMARY CARE PHYSICIAN:  Dr. Marisue Ivan.  This will be a new  patient of mine at [facility].   DISCHARGE DIAGNOSES:  1. Acetaminophen overdose.  2. Menorrhagia.  3. Depression.   DISCHARGE MEDICATIONS:  1. Zoloft 50 mg p.o. q.a.m.  2. Trazodone 50 mg p.o. q.h.s. for insomnia.   CONSULTATIONS:  Psychiatry, Dr. Jeanie Sewer.   PROCEDURES:  The patient had a pelvic ultrasound performed on December  15 that was normal.   LABORATORY DATA:  Upon admission, the patient had a white blood cell  count of 7.6, hemoglobin 13.5, AST 18, ALT 11, T-bilirubin 1.8, alk-phos  50, lipase 22.  Had a normal alcohol level, normal aspirin level,  acetaminophen level of 38.6, 13 hours post last oral intake.  Urine drug  screen was negative.  UA showed large blood, urine pregnancy was  negative.  Chest x-ray was negative.  D. dimer was also negative.   BRIEF HOSPITAL COURSE:  This is a 30 year old that was admitted for  acetaminophen overdose.   PROBLEMS:  1. Acetaminophen overdose.  Patient admitted to taking up to 16      Tylenol PM's within an 11-hour period, but her story seems to      change with different people.  She admits that this was an attempt      to hurt herself.  It was a suicide attempt.  While in the ED, she      was started on anacetalcystine for 24 hours.  She was brought up to      the floor and was unable to see by ACT.  While on the floor, she      remained alert and oriented x3.  She no longer had any suicidal or      homicidal ideations.  She was not having any visual or auditory  hallucinations.  She did seem to have a flat affect, and she did      admit to being depressed.  The patient was pain free during      hospitalization.  We immediately called for a psych consult and      placed her under observation of a sitter.  Dr. Jeanie Sewer did come      by and recommended that she be started on a mood stabilizer of      Depakote 500 q.h.s., also on SSRI Zoloft 50 mg and also on      Trazodone 50 mg to help her with sleep.  At this time, as her      primary care physician, I decided not to continue the Depakote but      to start her on Zoloft and the Trazodone and to arrange close  follow up.  2. Menorrhagia.  She has been followed at [facility] for several      months and has been known to have spotted bleeding in between      periods.  While in the hospital, she was started on Provera 10 mg,      and she is not going to be discharged on that medication, because      she will have follow up with the [facility] this week.  Her      hemoglobin was 13.5, so she was not in any kind of acute anemia due      to her bleed.   DISCHARGE INSTRUCTIONS:  She is to return to work on February 05, 2007.  She has no instructions on diet or activity.  She needs to call her  primary care doctor or return to the ER if she has any intentions of  hurting herself or others.  Also, if she has bad nausea, vomiting or any  drastic abnormal symptoms or reactions to her new medications.   FOLLOWUP:  She needs to follow up at [facility], phone  [phone] on December 18 at 10:30 a.m.  A follow up appointment needs to  be set up with Dr. Marisue Ivan.   CONDITION ON DISCHARGE:  She is in stable condition.      Marisue Ivan, MD  Electronically Signed     KL/MEDQ  D:  02/02/2007  T:  02/02/2007  Job:  [id number]
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
## 2010-07-02 NOTE — Op Note (Signed)
[name], Julie Hawkins           ACCOUNT NO.:  [id number]   MEDICAL RECORD NO.:  [id number]           PATIENT TYPE:   LOCATION:                                [facility]   PHYSICIAN:  Norton Blizzard, MD    DATE OF BIRTH:  [birth year]   DATE OF PROCEDURE:  04/27/2007  DATE OF DISCHARGE:  04/27/2007                               OPERATIVE REPORT   PREOPERATIVE DIAGNOSIS:  Menometrorrhagia.   POSTOPERATIVE DIAGNOSIS:  Menometrorrhagia.   PROCEDURE:  Dilation and curettage, NovaSure endometrial ablation.   SURGEON:  Norton Blizzard, MD.   ANESTHESIA:  General, paracervical block with 25% Marcaine with  epinephrine 20 ml.   ESTIMATED BLOOD LOSS:  Minimal.   INDICATIONS:  The patient is a 30 year old, G2, P2, with a long history  of menometrorrhagia.  She had used OCPs and Depo-Provera in the past and  was desiring endometrial ablation.  The risks and benefits of the  procedure were explained to the patient and written informed consent was  obtained on the day of surgery.  Of note, the patient has had a  bilateral tubal ligation three years ago and was not interested in  further childbearing.   FINDINGS:  A 10-week size anteverted uterus, moderate amount of  endometrial curettings.  The uterus sounded to 10 cm, the cervical  length was noted to be 5 cm and a cavity length of 5 cm, cavity width  was noted to be 3.7 cm.  Total ablation time was 78 seconds.   SPECIMENS:  Endometrial curettings sent to Pathology.   COMPLICATIONS:  None.   PROCEDURE DETAILS:  The patient was taken to the operating room where  general anesthesia was placed and found to be adequate.  She was then  prepped and draped in a sterile manner after being placed in a dorsal  lithotomy position.  Patient's bladder was catheterized and measured a  normal amount of clear, yellow urine.  A speculum was placed in the  patient's vagina, and the anterior lip of the cervix was grasped with a  tenaculum.  The  cervix, then the uterus, was sounded, the lengths were  obtained as above.  The uterus was then dilated to accommodate the  curette, which was used to perform a sharp curettage that yielded  moderate endometrial curettings.  After the curettage, a NovaSure  apparatus was then placed in the patient's uterine cavity.  A seeding  procedure was done, which yielded a cavity width of 3.7 cm.  Cavity  integrity was assured that the ablation procedure commenced and was done  without any complications.  Total ablation time was noted to be 78  seconds.  The apparatus was removed, overall good  hemostasis was noted, and all instruments were removed from the  patient's vagina.  The patient tolerated the procedure well.  Sponge and  instrument counts were correct x2.  She was taken to the recovery room,  extubated, awake, and in stable condition.      Norton Blizzard, MD  Electronically Signed     UAD/MEDQ  D:  04/27/2007  T:  04/27/2007  Job:  [id number]

## 2010-07-02 NOTE — Telephone Encounter (Signed)
Called patient and explained to her detrol not covered and vesicare prescribed and sent [facility].Kaiyon Hynes, Roselyn Meier

## 2010-07-02 NOTE — Consult Note (Signed)
Julie Hawkins NO.:  [id number]   MEDICAL RECORD NO.:  [id number]          PATIENT TYPE:  WOC   LOCATION:  WOC                          FACILITY:  [facility]   PHYSICIAN:  Antonietta Breach, M.D.  DATE OF BIRTH:  May 08, 1980   DATE OF CONSULTATION:  02/02/2007  DATE OF DISCHARGE:                                 CONSULTATION   REASON FOR CONSULTATION:  Overdose.   HISTORY OF PRESENT ILLNESS:  [name] is a 30 year old  female admitted to the [facility] on January 31, 2007, after an  overdose.   Upon the initial general medical evaluation, the patient initially  denied suicide attempt.  She then opened up and stated that she was  trying to kill herself when she took approximately 6 grams of Tylenol PM  over several hours.   Since that time, she has consistently denied suicidal thoughts.  She has  been socially appropriate and cooperative with staff.  Progress notes in  the chart show consisted absence of suicidal thoughts.  The patient  describes obstructive future goals and interests.  She is not having any  thoughts of harming others.  She has no delusions or hallucinations.   She describes interest in her children.  She has children that are under  the care at this time of her mother.   The patient has no racing thoughts   She did experience a short period of depressed mood lasting  approximately 2 days in response to a number of stresses including  marital discord.  She states that she and her husband are separated at  this time.  However, she is tolerating the experience better now.  She  has a determination to make the best of it, regardless of how the  relationship works out.   PAST PSYCHIATRIC HISTORY:  The patient gave the undersigned permission  to talk to her social worker at the [employer].  The undersigned  called Herbert Deaner.  Vikki Ports confirmed that the patient does have a  history of bipolar disorder.  She has been  on Depakote and Zoloft.  The  patient was not taking these at the time of the admission.   The psychiatric appointment for the patient at the [facility] is pending.   FAMILY PSYCHIATRIC HISTORY:  None known.   SOCIAL HISTORY:  The patient is currently separated from her husband and  living with her mother.  She has two children.  She works at [employer].  She does have a history of using marijuana, not alcohol.   PAST MEDICAL HISTORY:  1. Status post Tylenol PM overdose with medical clearance at this      time.  2. The patient does have a history of menorrhagia and has been      followed by the [facility].  They have      assessed uterine fibroids.   MEDICATIONS:  MAR is reviewed.  The patient is not on any psychotropics  at this time.  She did receive a one-time dose of acetylcysteine as a  Tylenol toxicity antidote.  ALLERGIES:  NO KNOWN DRUG ALLERGIES.   LABORATORY DATA:  Hepatic function panel shows SGOT 14, SGPT 9, albumin  3.1.  Urine HCG negative.  Drug screen is negative.  Aspirin negative.  Lipase normal.  Alcohol negative.  Initial acetaminophen level 38.6   WBC 7.6, hemoglobin 13.8, platelet count 256.   REVIEW OF SYSTEMS:  CONSTITUTIONAL:  Afebrile.  No weight loss.  HEAD:  No trauma.  EYES:  No visual changes.  EARS:  No hearing impairment.  NOSE:  No rhinorrhea.  MOUTH/THROAT:  No sore throat.  NEUROLOGIC:  No  focal motor or sensory changes.  PSYCHIATRIC:  As above.  The patient  has no history of suicide attempts.  CARDIOVASCULAR:  No chest pain,  palpitations.  RESPIRATORY:  No coughing or wheezing.  GASTROINTESTINAL:  No nausea, vomiting, diarrhea.  GENITOURINARY:  No dysuria.  SKIN:  Unremarkable.  ENDOCRINE/METABOLIC:  No heat or cold intolerance.  MUSCULOSKELETAL:  No deformities.  HEMATOLOGIC/LYMPHATIC:  Unremarkable.   PHYSICAL EXAMINATION:  VITAL SIGNS:  Temperature 97.4, pulse 84,  respiratory rate  18, blood pressure 99/65, O2 saturation on room air  100%.  GENERAL APPEARANCE:  Julie Hawkins is a young female sitting up in her  hospital bed without any abnormal involuntary movements.  OTHER MENTAL STATUS EXAM:  Julie Hawkins is alert.  Her eye contact is  good.  Her attention span is within normal limits.  Her concentration is  within normal limits.  She is oriented to all spheres.  Her memory is  intact to immediate recent and remote.  Her fund of knowledge and  intelligence are within normal limits.  Her speech involves normal rate  and prosody without dysarthria.   Thought process:  Logical, coherent, goal-directed.  No looseness of  associations.  Language expression and comprehension are intact.  Abstraction intact.  Thought content:  No thoughts of harming herself,  no thoughts of harming others.  No delusions, no hallucinations.  Insight is good.  Judgment is intact.   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  Rule out 296.80 bipolar disorder not otherwise specified.  Rule out adjustment disorder with mixed disturbance of emotions and  conduct.  Cannabis abuse.  AXIS II:  Deferred.  AXIS III:  See general medical history above.  AXIS IV:  Marital primary support group.  AXIS V:  55.   Julie Hawkins is no longer at risk to harm herself after having time in  the hospital to recover from her acute emotional reaction to marital  discord.  She agrees to call the emergency services immediately for any  thoughts of harming herself, thoughts of harming others or other  psychiatric emergency symptoms.   Because the patient does have a history of bipolar disorder, she will be  restarted on her primary mood stabilizer Depakote.  She will also be  restarted on her Zoloft for antidepression.   RECOMMENDATIONS:  Would restart Zoloft 250 mg q.a.m. for antidepression.  Would also restart Depakote at 500 mg q.h.s. as her mood stabilizer, and  increase the  Depakote to 1000 mg ER q.h.s. as tolerated.   Would check a CBC and liver function panel within the first week and  periodically thereafter to screen for Depakote adverse effects.   The patient has follow-up with a therapist.  She will also have follow-  up at the [facility] for psychotropic  medication management.  She will continue to work with her social  worker  at [employer].   The undersigned did recommend psychiatric inpatient hospitalization due  to the fact that the patient had been on psychotropic medications and  yet did have a severe depressive swing which resulted in self-harm.  The  inpatient psychiatric unit would provide the fastest way to get into  ongoing and further psychiatric evaluation and treatment.  However, the  patient declined, and she is not committable now that she has recovered  from her acute destructive emotional state.   The patient is motivated for outpatient care.   Additional medication recommendations:  As discussed with the patient,  in addition to the above, trazodone can be added at 25-50 mg q.h.s. for  insomnia and avoid the use of Tylenol PM or potentially addictive  medications.      Antonietta Breach, M.D.  Electronically Signed     JW/MEDQ  D:  02/02/2007  T:  02/03/2007  Job:  [id number]

## 2010-09-02 ENCOUNTER — Telehealth: Payer: Self-pay | Admitting: Family Medicine

## 2010-09-02 NOTE — Telephone Encounter (Signed)
Patient reports she had a tooth pulled last week and since she has been on antibiotic she now has a vaginal discharge that is greenish in color, appointment scheduled for tomorrow AM

## 2010-09-02 NOTE — Telephone Encounter (Signed)
Has a question about her depo and menses.  Also has been taking abx and thinks she has a yeast inf.  Wants to talk to nurse

## 2010-09-03 ENCOUNTER — Ambulatory Visit (INDEPENDENT_AMBULATORY_CARE_PROVIDER_SITE_OTHER): Payer: Medicaid Other | Admitting: Family Medicine

## 2010-09-03 DIAGNOSIS — H0520 Unspecified exophthalmos: Secondary | ICD-10-CM | POA: Insufficient documentation

## 2010-09-03 DIAGNOSIS — N760 Acute vaginitis: Secondary | ICD-10-CM

## 2010-09-03 DIAGNOSIS — N898 Other specified noninflammatory disorders of vagina: Secondary | ICD-10-CM

## 2010-09-03 DIAGNOSIS — A5901 Trichomonal vulvovaginitis: Secondary | ICD-10-CM | POA: Insufficient documentation

## 2010-09-03 LAB — POCT WET PREP (WET MOUNT): Clue Cells Wet Prep HPF POC: NEGATIVE

## 2010-09-03 LAB — TSH: TSH: 0.994 u[IU]/mL (ref 0.350–4.500)

## 2010-09-03 MED ORDER — METRONIDAZOLE 500 MG PO TABS: 500.0000 mg | ORAL_TABLET | Freq: Two times a day (BID) | ORAL | Status: AC

## 2010-09-03 NOTE — Assessment & Plan Note (Signed)
Noted incidently on exam. Not any real ROS or other exam indicators for hyperthyroid. Will obtain TSH to follow to check for hyperthyroid.

## 2010-09-03 NOTE — Assessment & Plan Note (Signed)
Based on trich seen on wet prep.  Plan treat with flagyl x 1 week. Discussed what Ivery Quale is an how one gets it.  Partner will need treatment and pt should use condoms until both partners get treatment.  Will f/u if not improved.

## 2010-09-03 NOTE — Patient Instructions (Signed)
Thank you for coming in today. You have Trichomoniasis. See the handout.  You and your partner should both be treated.  Use condoms until both you and your partner have completed a course of therapy.  If you do not feel better after a week or so come back.  I will call you about your thyroid test.

## 2010-09-03 NOTE — Progress Notes (Signed)
Pt notes thin white vaginal discharge starting 2 weeks ago with the start of an antibiotic course for a dental abscess. She notes vaginal itching and tried an OTC yeast infection treatment which she thinks did not help and perhaps made her worse. She denies any abdominal pain or dysurea. She feels pretty well overall.   ROS: Notes feeling hot and then cold a lot. No hair loss or skin changes. No palpitations.   Exam:  Vs noted.  Gen: Well NAD HEENT: EOMI, PERRL, MMM. Sclera visible above and below iris in both eyes. No neck mass.  Lungs: CTABL Nl WOB Heart: RRR no MRG Abd: NABS, NT, ND Exts: Non edematous BL  LE Vagina: Normal external genitalia. Thick yellow/white vaginal discharge present. Cervix normal and not tender.

## 2010-09-04 ENCOUNTER — Encounter: Payer: Self-pay | Admitting: Family Medicine

## 2010-09-13 ENCOUNTER — Ambulatory Visit: Payer: Medicaid Other

## 2010-09-17 ENCOUNTER — Ambulatory Visit: Payer: Medicaid Other

## 2010-09-19 ENCOUNTER — Ambulatory Visit (INDEPENDENT_AMBULATORY_CARE_PROVIDER_SITE_OTHER): Payer: Medicaid Other | Admitting: *Deleted

## 2010-09-19 DIAGNOSIS — Z309 Encounter for contraceptive management, unspecified: Secondary | ICD-10-CM

## 2010-09-19 MED ORDER — MEDROXYPROGESTERONE ACETATE 150 MG/ML IM SUSP
150.0000 mg | Freq: Once | INTRAMUSCULAR | Status: AC
  Administered 2010-09-19: 150 mg via INTRAMUSCULAR

## 2010-11-11 LAB — CBC
RDW: 14.2
WBC: 10.7 — ABNORMAL HIGH

## 2010-11-11 LAB — HCG, SERUM, QUALITATIVE: Preg, Serum: NEGATIVE

## 2010-11-25 LAB — URINE MICROSCOPIC-ADD ON

## 2010-11-25 LAB — URINALYSIS, ROUTINE W REFLEX MICROSCOPIC
Nitrite: NEGATIVE
Protein, ur: 30 — AB
Specific Gravity, Urine: 1.043 — ABNORMAL HIGH
Urobilinogen, UA: 1

## 2010-11-25 LAB — HEPATIC FUNCTION PANEL
AST: 14
AST: 18
Albumin: 3.1 — ABNORMAL LOW
Alkaline Phosphatase: 43
Bilirubin, Direct: 0.2
Indirect Bilirubin: 1.3 — ABNORMAL HIGH
Indirect Bilirubin: 1.6 — ABNORMAL HIGH
Total Bilirubin: 1.5 — ABNORMAL HIGH
Total Bilirubin: 1.8 — ABNORMAL HIGH

## 2010-11-25 LAB — COMPREHENSIVE METABOLIC PANEL
ALT: 10
Albumin: 4
Alkaline Phosphatase: 50
BUN: 6
Calcium: 9.8
Glucose, Bld: 85
Potassium: 3.7
Sodium: 142
Total Protein: 7.8

## 2010-11-25 LAB — RAPID URINE DRUG SCREEN, HOSP PERFORMED
Amphetamines: NOT DETECTED
Benzodiazepines: NOT DETECTED
Cocaine: NOT DETECTED
Opiates: NOT DETECTED
Tetrahydrocannabinol: NOT DETECTED

## 2010-11-25 LAB — CBC
MCV: 87.2
Platelets: 256
WBC: 7.6

## 2010-11-25 LAB — DIFFERENTIAL
Basophils Relative: 0
Lymphs Abs: 2.2
Monocytes Absolute: 0.5
Monocytes Relative: 7
Neutro Abs: 4.8
Neutrophils Relative %: 63

## 2010-11-25 LAB — LIPASE, BLOOD: Lipase: 22

## 2010-11-25 LAB — ETHANOL: Alcohol, Ethyl (B): <5

## 2010-11-25 LAB — D-DIMER, QUANTITATIVE: D-Dimer, Quant: <0.22

## 2010-12-20 ENCOUNTER — Ambulatory Visit (INDEPENDENT_AMBULATORY_CARE_PROVIDER_SITE_OTHER): Payer: Medicaid Other | Admitting: *Deleted

## 2010-12-20 DIAGNOSIS — Z309 Encounter for contraceptive management, unspecified: Secondary | ICD-10-CM

## 2010-12-20 LAB — POCT URINE PREGNANCY: Preg Test, Ur: NEGATIVE

## 2010-12-20 MED ORDER — MEDROXYPROGESTERONE ACETATE 150 MG/ML IM SUSP
150.0000 mg | Freq: Once | INTRAMUSCULAR | Status: AC
  Administered 2010-12-20: 150 mg via INTRAMUSCULAR

## 2010-12-26 ENCOUNTER — Encounter: Payer: Medicaid Other | Admitting: Family Medicine

## 2011-01-15 ENCOUNTER — Other Ambulatory Visit (HOSPITAL_COMMUNITY)
Admission: RE | Admit: 2011-01-15 | Discharge: 2011-01-15 | Disposition: A | Discharge status: Home / Self Care | Payer: Medicaid Other | Source: Ambulatory Visit | Attending: Family Medicine | Admitting: Family Medicine

## 2011-01-15 ENCOUNTER — Encounter: Payer: Self-pay | Admitting: Family Medicine

## 2011-01-15 ENCOUNTER — Ambulatory Visit (INDEPENDENT_AMBULATORY_CARE_PROVIDER_SITE_OTHER): Payer: Medicaid Other | Admitting: Family Medicine

## 2011-01-15 VITALS — BP 127/91 | HR 87 | Temp 97.6°F | Ht 63.6 in | Wt 151.0 lb

## 2011-01-15 DIAGNOSIS — Z01419 Encounter for gynecological examination (general) (routine) without abnormal findings: Secondary | ICD-10-CM | POA: Insufficient documentation

## 2011-01-15 DIAGNOSIS — J069 Acute upper respiratory infection, unspecified: Secondary | ICD-10-CM

## 2011-01-15 DIAGNOSIS — Z0000 Encounter for general adult medical examination without abnormal findings: Secondary | ICD-10-CM

## 2011-01-15 DIAGNOSIS — Z124 Encounter for screening for malignant neoplasm of cervix: Secondary | ICD-10-CM

## 2011-01-15 DIAGNOSIS — Z23 Encounter for immunization: Secondary | ICD-10-CM

## 2011-01-15 DIAGNOSIS — F329 Major depressive disorder, single episode, unspecified: Secondary | ICD-10-CM

## 2011-01-15 MED ORDER — CITALOPRAM HYDROBROMIDE 20 MG PO TABS: 40.0000 mg | ORAL_TABLET | Freq: Every day | ORAL | Status: DC

## 2011-01-15 NOTE — Assessment & Plan Note (Signed)
Continue with celexa.  No suicidal thoughts. Feels improved.

## 2011-01-15 NOTE — Patient Instructions (Signed)
Cervical Cancer The cervix is the opening and bottom part of the uterus between the birth canal (vagina) and the uterus (womb). Precancerous changes in the cells on the top layer of the cervix are an early sign that cervical cancer may develop. Cervical cancer is a fairly common cancer. It occurs most often in women ages 30 to 30. Cells of the cervix act very much like skin cells. These cells are exposed to toxins, viruses, and germs (bacteria) that may cause abnormal changes (cervical dysplasia).   Cervical dysplasia (abnormal growth) is judged by the thickness of the layer of abnormal cells. The earliest change that can be seen with a microscope is called mild dysplasia. If not treated, these precancerous changes may become moderate to severe, to cancer cells on the surface of the cervix (carcinoma in situ), to invasive cancer (cancer cells below the surface of the cervix). There are two kinds of cancers of the cervix:  Squamous (scale-like) cell carcinoma, starts in the flat or scale-like cells that line the cervix. This type of cancer is a sexually transmitted infection caused by the human papilloma virus (HPV).     Adenocarcinoma (starts on the surface of a gland) starts in glandular cells that line the cervix.  CAUSES The risk of getting cancer of the cervix is related to your lifestyle, sexual history, health, and your immune system. Causes and risks include:  Having a sexually transmitted virus infection. These infections are strongly linked with cancer of the cervix. They include:     Chlamydia.    Herpes.    Human papilloma virus (HPV).     Becoming sexually active before age 30.     Having more than 1 sexual partner, or having sex with someone who has more than one sex partner.     Not using condoms with sexual partners.     Having had cancer of the vagina or vulva. Or having sex with someone whose previous sexual partner had cancer of the cervix or cervical dysplasia.      Having a sexual partner who has or had cancer of the penis.     Smoking.    Having a weakened immune system. For example, HIV or other immune deficiency disorders (organ transplant).     Being the daughter of a woman who took DES (diethylstilbestrol) during pregnancy.     A history of cancer of the cervix in your sister or mother.     African/American, Hispanic, and Asian/Pacific Islander women have a higher risk of getting cervical cancer.     A previous history of dysplasia (abnormal growth) of the cervix.  SYMPTOMS   Early stages of cervical cancer usually have no symptoms. Once the cancer invades the cervix and surrounding tissues, the woman may have vague symptoms, such as:  Abnormal vaginal bleeding or menstrual bleeding that is longer or heavier than usual.     Bleeding after intercourse, douching, or a pap test.     Vaginal bleeding following menopause.     Abnormal vaginal discharge.     Pelvic discomfort or pain.     Having an abnormal Pap test. (A Pap test is an exam where cells are scraped from the cervix and canal of the cervix, and are viewed under a microscope.)  Symptoms of more advanced cervical cancer may include:  Loss of appetite or weight loss.     Tiredness (fatigue).     Back and leg pain.     Inability to control urination or  bowel movements.  DIAGNOSIS   Diagnosis of cervical cancer is found earliest and done best with regular pelvic exams and Pap tests. If abnormalities are found, the Pap test may be repeated in 3 months, or your caregiver may do additional tests, such as:  Colposcopy: A special microscope allows the caregiver to magnify and closely examine the cells of the cervix, vagina, and vulva.     Cervical biopsies: Small tissue samples are taken from the cervix to be examined under a microscope by a specialist. This can be done in your caregiver's office. This is done to determine if there is dysplasia or cancer cells. You cannot tell the  stage of cervical cancer with a cervical biopsy.  A cone biopsy removes tissue to be tested for cancer. It can also be used to remove all the cancerous tissue. In a cone biopsy, a large, cone shaped tissue from the cervix is taken. This procedure can be done by:  Cold cone biopsy or laser cone. Both are done in an operating room under an anesthetic (you are asleep).     LEEP (loop electrosurgical excision procedure). Can be done in a doctor's office with a local anesthetic.     Other tests may be needed, including:     Cystoscopy (looking into the bladder with a lighted tube).     Proctoscopy or Sigmoidoscopy (looking into the rectum and lower intestine with a lighted tube).     Ultrasound.    CT Scan.     MRI.    Laparoscopy (using a lighted tube for examination).  There are different stages of cervical cancer:    Stage 0. CIS (carcinoma in situ). This first stage of cancer is the last and most serious stage of dysplasia (see above).     Stage 1. The tumor is in the uterus and cervix only.     Stage 2. The tumor has spread to the upper vagina. The cancer has spread beyond the uterus, but not to the pelvic walls or lower third of the vagina.     Stage 3. The tumor has invaded the side wall of the pelvis, and the lower third of the vagina. Blockage of the ureters (tubes that carry urine) from the tumor may cause urine to back up and swell the kidneys (hydronephrosis).     Stage 4. The tumor has spread to the rectum or bladder. In the later part of this stage, it has also spread to distant organs, like the lungs.  If abnormal cells are found early, it may be possible to avoid removing the uterus. If caught at an early stage, a woman can still have children and chances for a cure are good. Once cervical cancer reaches a late stage, more aggressive measures may be needed. Untreated, the cancer may spread to nearby areas or more distant sites in the body, through the blood and lymphatic  system. Cervical cancer is not contagious and does not pose a risk to others.   TREATMENT   Options for removal include the following:  Cone biopsy (see above).     Removal of the entire uterus and cervix (simple hysterectomy).     If the cancer has invaded deeper layers of the cervix and has spread to the uterus, more extensive treatment may be needed. This may include removal of the uterus, cervix, upper vagina, lymph nodes, and surrounding tissue (modified radical hysterectomy). This procedure depends on the extent of the cancer and a woman's age. The ovaries may  be left in place or removed.     Sometimes medicines for treating cancer (radiation and/or chemotherapy) can be used. This is done when the cancer has spread beyond the cervix and uterus. These treatments may be used if a woman is not a good candidate for surgery. Age or other medical conditions may prevent a woman from being a good candidate for surgery. Radiation therapy may be used before or after surgery to shrink tumor cells and kill any remaining tumor cells.     A combination of surgery, radiation, and chemotherapy may be needed, depending on the extent of the cancer.     Biological response modifiers (BRMs) are substances that help strengthen the immune system's fight against cancer or infection. Interferon is a BRM that is sometimes used in the treatment of cervical cancer. It may be used in combination with chemotherapy.     Treatment of squamous cell cancer or adenocarcinoma of the cervix are essentially the same.  Side effects of treatment:  A hysterectomy may cause inability to control urination or psychological sexual problems. There may be swelling in the legs if lymph nodes are removed. Occasionally, blood transfusions may be required. Allergic reactions can occur. Hemorrhage, infection, and rarely death, can occur.     Chemotherapy and radiation therapy may cause a wide variety of side effects. Including:     Hair  loss.     Tiredness (fatigue).     Lessened ability to fight infections.     Feeling sick to your stomach (nausea).     Vomiting.    Diarrhea.    Urinary problems.     Atrophic vaginitis (inflammation of the vagina) with painful intercourse.     Biological response modifiers such as interferon may cause flu-like symptoms. These include:     Body aches.     Nausea.    Vomiting.    Fatigue.  Treatment of cervical cancer in a pregnant woman:  Treatment of cervical cancer in pregnant women depends on the patient's culture, religious feelings, and ethical considerations.     A pregnant woman with cancer at Stage 0 or Stage 1, with microinvasion of 3mm or less (cancerous tissue has spread to a very small area), can deliver her baby vaginally. She can then be treated 6 weeks after the delivery, with surgery.     Other factors that can determine treatment include:     Size of the tumor.     Location of the tumor.     Stage of the pregnancy.     Desire of the patient to go on with the pregnancy.     Radiation with or without chemotherapy, following delivery and/or surgery, may be advised or necessary to prevent the cancerous tumor from coming back (recurrences).     Delaying treatment until the baby has a better chance to survive is better for the baby.  Follow-up on cervical cancer:  After treatment, follow-up is important to look for reoccurrence.     A pelvic exam and Pap test, if the cervix is intact, will be done every 3 months for at least 2 years. After the 2 year phase, a pelvic exam and Pap test will be done every 6 months. Because cancer tends to come back at the same spot or spread to the lungs and liver, chest x-rays and liver function tests are done yearly.     If a woman has had a hysterectomy, the top of the vagina is cuffed or closed. A colposcopy  may be done at follow-up visits to examine the vaginal cuff.     Tell your caregiver about any new or worsening  problems.  LENGTH OF ILLNESS The outcome for a woman with cervical cancer depends on many factors, including:  Overall health.     Age when first diagnosed.     The type and growth of specific cancer cells.     How far the disease has spread.  After treatment, the length of time to live depends on the stage of the cancer. Your caregiver will discuss this with you and help you plan your treatment for the best possible outcome. PREVENTION    A Pap test is done to screen for cervical cancer.     The first Pap test should be done at age 28.     Between ages 56 and 72, Pap tests are repeated every 2 years.     Beginning at age 63, you are advised to have a Pap test every 3 years as long as your past 3 Pap tests have been normal.   Some women have medical problems that increase the chance of getting cervical cancer. Talk to your caregiver about these problems. It is especially important to talk to your caregiver if a new problem develops soon after your last Pap test. In these cases, your caregiver may recommend more frequent screening and Pap tests.   The above recommendations are the same for women who have or have not gotten the vaccine for HPV (Human Papillomavirus).     If you had a hysterectomy for a problem that was not a cancer or a condition that could lead to cancer, then you no longer need Pap tests. However, even if you no longer need a Pap test, a regular exam is a good idea to make sure no other problems are starting.        If you are between ages 5 and 66, and you have had normal Pap tests going back 10 years, you no longer need Pap tests. However, even if you no longer need a Pap test, a regular exam is a good idea to make sure no other problems are starting.        If you have had past treatment for cervical cancer or a condition that could lead to cancer, you need Pap tests and screening for cancer for at least 20 years after your treatment.      If Pap tests have been  discontinued, risk factors (such as a new sexual partner) need to be re-assessed to determine if screening should be resumed.     Some women may need screenings more often if they are at high risk for cervical cancer.     A woman can lower her risk for getting cervical cancer by:     Quitting smoking.     Waiting to have intercourse until age 35 or later.     Having only one sexual partner in a lifetime.     Using latex condoms.     Practicing safe sex with each sexual encounter.     Remaining celibate (not having sex). Celibate women do not get squamous cell cancer of the cervix, but they can get adenocarcinoma of the cervix.     A woman should ask her sexual partners about their sexual histories. By doing this, she can avoid partners that are high risk.     Identifying early warning signs of cervical cancer is also important. A woman should  see her caregiver, and may need to be treated, if she has any of the following signs or symptoms:     Vaginal discharge that does not seem normal.     Vaginal bleeding between periods.     Bleeding with intercourse or after menopause.     Pain with intercourse (dyspareunia).     Vaccines are available to help prevent certain types of human papilloma virus infection and cervical cancer.  HOME CARE INSTRUCTIONS    Get a yearly gynecology examination and Pap test, or as advised by your caregiver.     Get the Human papilloma virus (HPV) vaccine.     Do not smoke.     Tell your caregiver if you have a family history of cancer of the cervix.     Do not have sexual intercourse before age 68.     Practice safe sex:     Use condoms.     Have one sex partner.     Make sure you are the only sex partner of your sex partner.  SEEK MEDICAL CARE IF:    You have abnormal vaginal bleeding.     You have abnormal vaginal discharge.     You have vaginal bleeding after sexual intercourse, douching, or using tampons.     You develop vaginal  bleeding after menopause.     You have pain with sexual intercourse.     You have unexplained weight loss.     You have unexplained tiredness.     You feel pressure with urination and/or with a bowel movement.  SEEK IMMEDIATE MEDICAL CARE IF:    You have heavy vaginal bleeding, with or without clots.     You cannot urinate, or you have blood in your urine.     You have blood or pressure with a bowel movement.     You develop severe back, stomach, or pelvic pain.     You develop an unexplained temperature of 100 F (37.9 C), or higher.  Document Released: 02/03/2005 Document Revised: 10/16/2010 Document Reviewed: 12/13/2008 [facility] Patient Information 2012 [place], [place].

## 2011-01-15 NOTE — Progress Notes (Signed)
Addended by: Edd Arbour on: 01/15/2011 04:56 PM   Modules accepted: Orders

## 2011-01-15 NOTE — Assessment & Plan Note (Signed)
OTC decongestants. Ibuprofen and tylenol. Fluids and rest.

## 2011-01-15 NOTE — Progress Notes (Signed)
  Subjective:    Patient ID: Julie Hawkins, female    DOB: 03-28-1980, 30 y.o.   MRN: [id number]  HPI 1. Preventative health visit No current concerns.  No safety issues. No drug/alcohol abuse.  PAP performed Tdap/flu due  2. Depression Feels like celexa is helping. No hypersomnia or hyposomnia. No dysphoria. No suicidal thoughts.  3. URI Started 3 days ago. Cough, green sputum. No chills or fever. No chest pain or SOB.    Review of Systems No fevers, chills, night sweats.     Objective:   Physical Exam Filed Vitals:   01/15/11 1451  BP: 127/91  Pulse: 87  Temp: 97.6 F (36.4 C)  TempSrc: Oral  Height: 5' 3.6" (1.615 m)  Weight: 151 lb (68.493 kg)  Lungs:  Normal respiratory effort, chest expands symmetrically. Lungs are clear to auscultation, no crackles or wheezes. Heart - Regular rate and rhythm.  No murmurs, gallops or rubs.    Extremities:  No cyanosis, edema, or deformity noted with good range of motion of all major joints.   Abdomen: soft and non-tender without masses, organomegaly or hernias noted.  No guarding or rebound Female genitalia: normal external genitalia, vulva, vagina, cervix, uterus and adnexa     Assessment & Plan:

## 2011-01-16 DIAGNOSIS — Z23 Encounter for immunization: Secondary | ICD-10-CM

## 2011-01-16 NOTE — Progress Notes (Signed)
Addended by: Deno Etienne on: 01/16/2011 06:02 PM   Modules accepted: Orders

## 2011-01-17 ENCOUNTER — Encounter: Payer: Self-pay | Admitting: Family Medicine

## 2011-01-20 ENCOUNTER — Telehealth: Payer: Self-pay | Admitting: *Deleted

## 2011-01-20 NOTE — Telephone Encounter (Signed)
Patient walked into office to be triaged for office appt.  States she had flu shot at office visit last week.  Was dx'd with sinus infection but not given any antibiotics.  Now c/o body aches, cold symptoms, green productive cough & nasal drainage.  Denies fever. Has been taking Tylenol Cough & Cold med.  Also states she was dx'd with trich 3 months ago and now has been having green vaginal d/c and itching.  Denies having unprotected intercourse.  Appt given scheduled for tomorrow (01/21/11) with Dr. Louanne Belton for 2:30 pm.  Patient informed she can also take ibuprofen OTC with food for body aches (2 tabs every 6-8 hours as needed).   Gaylene Brooks, RN

## 2011-01-21 ENCOUNTER — Encounter: Payer: Self-pay | Admitting: Family Medicine

## 2011-01-21 ENCOUNTER — Ambulatory Visit (INDEPENDENT_AMBULATORY_CARE_PROVIDER_SITE_OTHER): Payer: Medicaid Other | Admitting: Family Medicine

## 2011-01-21 VITALS — BP 110/82 | HR 107 | Temp 98.1°F | Ht 63.5 in | Wt 151.6 lb

## 2011-01-21 DIAGNOSIS — A599 Trichomoniasis, unspecified: Secondary | ICD-10-CM | POA: Insufficient documentation

## 2011-01-21 DIAGNOSIS — A5901 Trichomonal vulvovaginitis: Secondary | ICD-10-CM

## 2011-01-21 DIAGNOSIS — N760 Acute vaginitis: Secondary | ICD-10-CM

## 2011-01-21 LAB — POCT WET PREP (WET MOUNT): Yeast Wet Prep HPF POC: NEGATIVE

## 2011-01-21 MED ORDER — BENZONATATE 100 MG PO CAPS: 100.0000 mg | ORAL_CAPSULE | Freq: Three times a day (TID) | ORAL | Status: AC | PRN

## 2011-01-21 MED ORDER — METRONIDAZOLE 500 MG PO TABS: 500.0000 mg | ORAL_TABLET | Freq: Two times a day (BID) | ORAL | Status: AC

## 2011-01-21 NOTE — Patient Instructions (Signed)
It was good to see you today. You have trichomonas again.  I am going to treat you with 7 days of the antibiotic Flagyl.  (This will also hopefully help with your cough some.)  Your partner needs to be treated as well.  You should not have sex with him until both of you have completed your treatment courses.  He can go to the health department to get treatment if he is not a patient somewhere else. I am going to recommend 600mg  of motrin every 6 hours and some cough drops.  i am also sending in a prescription for tessalon perls, which can be helpful with cough.

## 2011-01-21 NOTE — Progress Notes (Signed)
Subjective: The patient presents today with 2 complaints.  1) the patient reports that her vaginal discharge briefly got better following her treatment for trichomonas 3 months ago, however it has returned. She has only had one sexual partner over the last 3 months and believes that he was treated for the infection after her initial diagnosis. She's not been having any fevers, chills, or abdominal pain.  2) the patient also reports that, for the last 7 days, she has been having problems with nasal congestion and a cough. Her cough has been getting somewhat worse recently, she has been coughing enough that has been giving her both back and neck aches. Is accompanied by slight nausea, and intermittent headache. She has not been running any fevers, not having any diarrhea, and has not been having any other body aches. She's been taking DayQuil and NyQuil but otherwise has not been taking any medications.  Objective:  Filed Vitals:   01/21/11 1400  BP: 110/82  Pulse: 107  Temp: 98.1 F (36.7 C)   Gen: No acute distress CV: Regular rate and rhythm Resp: Mildly coarse bilaterally, decreased air movement, no focal findings Abd: Soft, nontender, nondistended, no adnexal masses Pelvic: Purulent yellow-green discharge with significant malodorous odor, no cervical friability, no mucosal friability. Normal external genitalia.  Assessment/Plan: Treat cough with cough drops and tessalon.  RTC in 7 days if not better.  Do not think there is a bacterial component.  Also treat back/neck ache with motrin.  Please also see individual problems in problem list for problem-specific plans.

## 2011-01-21 NOTE — Assessment & Plan Note (Signed)
Tx with 7 days flagyl (for both BV and trich).  Stressed importance of treatment of partner and the seriousness of infection.

## 2011-01-22 LAB — GC/CHLAMYDIA PROBE AMP, GENITAL: GC Probe Amp, Genital: NEGATIVE

## 2011-03-21 ENCOUNTER — Ambulatory Visit (INDEPENDENT_AMBULATORY_CARE_PROVIDER_SITE_OTHER): Payer: Medicaid Other | Admitting: *Deleted

## 2011-03-21 DIAGNOSIS — Z309 Encounter for contraceptive management, unspecified: Secondary | ICD-10-CM

## 2011-03-21 MED ORDER — MEDROXYPROGESTERONE ACETATE 150 MG/ML IM SUSP
150.0000 mg | Freq: Once | INTRAMUSCULAR | Status: AC
  Administered 2011-03-21: 150 mg via INTRAMUSCULAR

## 2011-06-06 ENCOUNTER — Ambulatory Visit (INDEPENDENT_AMBULATORY_CARE_PROVIDER_SITE_OTHER): Payer: Medicaid Other | Admitting: *Deleted

## 2011-06-06 DIAGNOSIS — Z309 Encounter for contraceptive management, unspecified: Secondary | ICD-10-CM

## 2011-06-06 DIAGNOSIS — Z3049 Encounter for surveillance of other contraceptives: Secondary | ICD-10-CM

## 2011-06-06 MED ORDER — MEDROXYPROGESTERONE ACETATE 150 MG/ML IM SUSP
150.0000 mg | Freq: Once | INTRAMUSCULAR | Status: AC
  Administered 2011-06-06: 150 mg via INTRAMUSCULAR

## 2011-09-10 ENCOUNTER — Ambulatory Visit (INDEPENDENT_AMBULATORY_CARE_PROVIDER_SITE_OTHER): Payer: Medicaid Other | Admitting: *Deleted

## 2011-09-10 DIAGNOSIS — Z309 Encounter for contraceptive management, unspecified: Secondary | ICD-10-CM

## 2011-09-10 LAB — POCT URINE PREGNANCY: Preg Test, Ur: NEGATIVE

## 2011-09-10 MED ORDER — MEDROXYPROGESTERONE ACETATE 150 MG/ML IM SUSP
150.0000 mg | Freq: Once | INTRAMUSCULAR | Status: AC
  Administered 2011-09-10: 150 mg via INTRAMUSCULAR

## 2011-09-10 NOTE — Progress Notes (Signed)
Late for Depo today. Urine pregnancy test performed and is negative. Patient  States she has not had sex for more than 2 weeks. Depo given . Use extra protection for next 7 days.

## 2011-12-08 ENCOUNTER — Encounter: Payer: Medicaid Other | Admitting: *Deleted

## 2011-12-08 NOTE — Progress Notes (Signed)
This encounter was created in error - please disregard.

## 2011-12-17 ENCOUNTER — Ambulatory Visit (INDEPENDENT_AMBULATORY_CARE_PROVIDER_SITE_OTHER): Payer: Medicaid Other | Admitting: *Deleted

## 2011-12-17 DIAGNOSIS — Z309 Encounter for contraceptive management, unspecified: Secondary | ICD-10-CM

## 2011-12-17 LAB — POCT URINE PREGNANCY: Preg Test, Ur: NEGATIVE

## 2011-12-17 MED ORDER — MEDROXYPROGESTERONE ACETATE 150 MG/ML IM SUSP
150.0000 mg | Freq: Once | INTRAMUSCULAR | Status: AC
  Administered 2011-12-17: 150 mg via INTRAMUSCULAR

## 2011-12-17 NOTE — Progress Notes (Signed)
Patient is late for Depo. Urine pregnancy test done and is negative. She denies any sexual activity in past two weeks.  Depo given today and advised to use extra protection for next 7 days.

## 2011-12-29 ENCOUNTER — Other Ambulatory Visit (HOSPITAL_COMMUNITY)
Admission: RE | Admit: 2011-12-29 | Discharge: 2011-12-29 | Disposition: A | Discharge status: Home / Self Care | Payer: Medicaid Other | Source: Ambulatory Visit | Attending: Family Medicine | Admitting: Family Medicine

## 2011-12-29 ENCOUNTER — Ambulatory Visit (INDEPENDENT_AMBULATORY_CARE_PROVIDER_SITE_OTHER): Payer: Medicaid Other | Admitting: Family Medicine

## 2011-12-29 ENCOUNTER — Encounter: Payer: Self-pay | Admitting: Family Medicine

## 2011-12-29 VITALS — BP 122/87 | HR 74 | Temp 97.7°F | Ht 63.5 in | Wt 161.4 lb

## 2011-12-29 DIAGNOSIS — Z716 Tobacco abuse counseling: Secondary | ICD-10-CM | POA: Insufficient documentation

## 2011-12-29 DIAGNOSIS — Z113 Encounter for screening for infections with a predominantly sexual mode of transmission: Secondary | ICD-10-CM | POA: Insufficient documentation

## 2011-12-29 DIAGNOSIS — N898 Other specified noninflammatory disorders of vagina: Secondary | ICD-10-CM

## 2011-12-29 DIAGNOSIS — F17200 Nicotine dependence, unspecified, uncomplicated: Secondary | ICD-10-CM

## 2011-12-29 DIAGNOSIS — Z720 Tobacco use: Secondary | ICD-10-CM

## 2011-12-29 DIAGNOSIS — N760 Acute vaginitis: Secondary | ICD-10-CM

## 2011-12-29 DIAGNOSIS — A5901 Trichomonal vulvovaginitis: Secondary | ICD-10-CM

## 2011-12-29 DIAGNOSIS — A599 Trichomoniasis, unspecified: Secondary | ICD-10-CM

## 2011-12-29 DIAGNOSIS — N920 Excessive and frequent menstruation with regular cycle: Secondary | ICD-10-CM

## 2011-12-29 LAB — CBC WITH DIFFERENTIAL/PLATELET
HCT: 41.5 % (ref 36.0–46.0)
Hemoglobin: 14.1 g/dL (ref 12.0–15.0)
Lymphs Abs: 2.6 10*3/uL (ref 0.7–4.0)
MCH: 29.4 pg (ref 26.0–34.0)
Monocytes Relative: 10 % (ref 3–12)
Neutro Abs: 8.2 10*3/uL — ABNORMAL HIGH (ref 1.7–7.7)
Neutrophils Relative %: 67 % (ref 43–77)
RBC: 4.79 MIL/uL (ref 3.87–5.11)

## 2011-12-29 LAB — POCT WET PREP (WET MOUNT)

## 2011-12-29 LAB — COMPREHENSIVE METABOLIC PANEL
Albumin: 4.3 g/dL (ref 3.5–5.2)
CO2: 25 mEq/L (ref 19–32)
Calcium: 9.4 mg/dL (ref 8.4–10.5)
Glucose, Bld: 72 mg/dL (ref 70–99)
Potassium: 3.8 mEq/L (ref 3.5–5.3)
Sodium: 142 mEq/L (ref 135–145)
Total Protein: 7.3 g/dL (ref 6.0–8.3)

## 2011-12-29 LAB — TSH: TSH: 0.8 u[IU]/mL (ref 0.350–4.500)

## 2011-12-29 MED ORDER — BUPROPION HCL ER (SR) 150 MG PO TB12: 150.0000 mg | ORAL_TABLET | Freq: Two times a day (BID) | ORAL | Status: DC

## 2011-12-29 MED ORDER — FLUCONAZOLE 150 MG PO TABS: 150.0000 mg | ORAL_TABLET | Freq: Once | ORAL | Status: DC

## 2011-12-29 MED ORDER — METRONIDAZOLE 500 MG PO TABS: 2000.0000 mg | ORAL_TABLET | Freq: Once | ORAL | Status: DC

## 2011-12-29 NOTE — Progress Notes (Signed)
  Subjective:    Patient ID: Julie Hawkins, female    DOB: 10-31-1980, 31 y.o.   MRN: [id number]  HPI CPE. Not due for pap.  1. Vaginal discharge. Present for days-weeks. Her boyfriend dx with trichomonas recently and just now treated. Not using condoms. Pt has a history of trichomonas earlier this year. She is having white discharge, mild pelvic irritation, irregular bleeding (taking depo). Denies any lesions, pelvic pain, fevers.  2. Menorrhagia. Has been on depo about 2 years after an endometrial ablation procedure. She states she was told would need depo to prevent this from happening again. No current bleeding, weight changes, abdominal pain, diarrhea, constipation.  3. Tobacco use. Smokes 1ppd. She desires to stop. Requests medication for assistance. Denies cough, dyspnea.  Past Medical History  Diagnosis Date  . Depression    Past Surgical History  Procedure Date  . Endometrial ablation    Julie Hawkins does not currently have medications on file. History   Social History  . Marital Status: Married    Spouse Name: N/A    Number of Children: N/A  . Years of Education: N/A   Occupational History  . Not on file.   Social History Main Topics  . Smoking status: Current Every Day Smoker -- 1.0 packs/day    Types: Cigarettes  . Smokeless tobacco: Not on file  . Alcohol Use: No  . Drug Use: No  . Sexually Active: Yes   Other Topics Concern  . Not on file   Social History Narrative  . No narrative on file   Review of Systems See HPI otherwise negative.  reports that she has been smoking Cigarettes.  She has been smoking about 1 pack per day. She does not have any smokeless tobacco history on file.     Objective:   Physical Exam  Vitals reviewed. Constitutional: She is oriented to person, place, and time. She appears well-developed and well-nourished. No distress.  HENT:  Head: Normocephalic and atraumatic.  Mouth/Throat: Oropharynx is clear and moist.  Eyes:  EOM are normal. Pupils are equal, round, and reactive to light.  Neck: Neck supple. No thyromegaly present.  Cardiovascular: Normal rate, regular rhythm and normal heart sounds.   No murmur heard. Pulmonary/Chest: Effort normal and breath sounds normal. No respiratory distress. She has no wheezes. She has no rales.  Abdominal: Soft. Bowel sounds are normal. She exhibits no distension. There is no tenderness. There is no rebound.  Genitourinary:       White discharge. Mucosa is TTP. No motion tenderness or adnexal masses palpated. No cervical lesions.  Breast exam normal.  Musculoskeletal: She exhibits no edema and no tenderness.  Lymphadenopathy:    She has no cervical adenopathy.  Neurological: She is alert and oriented to person, place, and time.  Skin: No rash noted. She is not diaphoretic.  Psychiatric: She has a normal mood and affect.          Assessment & Plan:

## 2011-12-29 NOTE — Assessment & Plan Note (Addendum)
Reports definite recurrent exposure today. BF treated recently. Will repeat flagyl treatment. Discussed transmission method and to use condoms for protection. Consider repeat wet prep to ensure adequate treatment since this is recurrent or active > 1 year now.

## 2011-12-29 NOTE — Patient Instructions (Addendum)
Nice to meet you. You might benefit from changing depo shots to Mirena IUD. Make appointment with your GYN to discuss this change. Pick up flagyl and take tablets together. DO NOT DRINK ALCOHOL WITH THIS MEDICATION. You must use condoms to prevent infections.  Quitting smoking is very important to your health. Pick a quit date one week after you start taking wellbutrin. Start wellbutrin 150 mg once daily for 3 days; increase to 150 mg twice daily; treatment should continue for 7-12 weeks. Make appointment for check up in one month or sooner if needed.   Smoking Cessation, Tips for Success YOU CAN QUIT SMOKING If you are ready to quit smoking, congratulations! You have chosen to help yourself be healthier. Cigarettes bring nicotine, tar, carbon monoxide, and other irritants into your body. Your lungs, heart, and blood vessels will be able to work better without these poisons. There are many different ways to quit smoking. Nicotine gum, nicotine patches, a nicotine inhaler, or nicotine nasal spray can help with physical craving. Hypnosis, support groups, and medicines help break the habit of smoking. Here are some tips to help you quit for good.  Throw away all cigarettes.  Clean and remove all ashtrays from your home, work, and car.  On a card, write down your reasons for quitting. Carry the card with you and read it when you get the urge to smoke.  Cleanse your body of nicotine. Drink enough water and fluids to keep your urine clear or pale yellow. Do this after quitting to flush the nicotine from your body.  Learn to predict your moods. Do not let a bad situation be your excuse to have a cigarette. Some situations in your life might tempt you into wanting a cigarette.  Never have "just one" cigarette. It leads to wanting another and another. Remind yourself of your decision to quit.  Change habits associated with smoking. If you smoked while driving or when feeling stressed, try other  activities to replace smoking. Stand up when drinking your coffee. Brush your teeth after eating. Sit in a different chair when you read the paper. Avoid alcohol while trying to quit, and try to drink fewer caffeinated beverages. Alcohol and caffeine may urge you to smoke.  Avoid foods and drinks that can trigger a desire to smoke, such as sugary or spicy foods and alcohol.  Ask people who smoke not to smoke around you.  Have something planned to do right after eating or having a cup of coffee. Take a walk or exercise to perk you up. This will help to keep you from overeating.  Try a relaxation exercise to calm you down and decrease your stress. Remember, you may be tense and nervous for the first 2 weeks after you quit, but this will pass.  Find new activities to keep your hands busy. Play with a pen, coin, or rubber band. Doodle or draw things on paper.  Brush your teeth right after eating. This will help cut down on the craving for the taste of tobacco after meals. You can try mouthwash, too.  Use oral substitutes, such as lemon drops, carrots, a cinnamon stick, or chewing gum, in place of cigarettes. Keep them handy so they are available when you have the urge to smoke.  When you have the urge to smoke, try deep breathing.  Designate your home as a nonsmoking area.  If you are a heavy smoker, ask your caregiver about a prescription for nicotine chewing gum. It can ease your  withdrawal from nicotine.  Reward yourself. Set aside the cigarette money you save and buy yourself something nice.  Look for support from others. Join a support group or smoking cessation program. Ask someone at home or at work to help you with your plan to quit smoking.  Always ask yourself, "Do I need this cigarette or is this just a reflex?" Tell yourself, "Today, I choose not to smoke," or "I do not want to smoke." You are reminding yourself of your decision to quit, even if you do smoke a cigarette. HOW WILL I  FEEL WHEN I QUIT SMOKING?  The benefits of not smoking start within days of quitting.  You may have symptoms of withdrawal because your body is used to nicotine (the addictive substance in cigarettes). You may crave cigarettes, be irritable, feel very hungry, cough often, get headaches, or have difficulty concentrating.  The withdrawal symptoms are only temporary. They are strongest when you first quit but will go away within 10 to 14 days.  When withdrawal symptoms occur, stay in control. Think about your reasons for quitting. Remind yourself that these are signs that your body is healing and getting used to being without cigarettes.  Remember that withdrawal symptoms are easier to treat than the major diseases that smoking can cause.  Even after the withdrawal is over, expect periodic urges to smoke. However, these cravings are generally short-lived and will go away whether you smoke or not. Do not smoke!  If you relapse and smoke again, do not lose hope. Most smokers quit 3 times before they are successful.  If you relapse, do not give up! Plan ahead and think about what you will do the next time you get the urge to smoke. LIFE AS A NONSMOKER: MAKE IT FOR A MONTH, MAKE IT FOR LIFE Day 1: Hang this page where you will see it every day. Day 2: Get rid of all ashtrays, matches, and lighters. Day 3: Drink water. Breathe deeply between sips. Day 4: Avoid places with smoke-filled air, such as bars, clubs, or the smoking section of restaurants. Day 5: Keep track of how much money you save by not smoking. Day 6: Avoid boredom. Keep a good book with you or go to the movies. Day 7: Reward yourself! One week without smoking! Day 8: Make a dental appointment to get your teeth cleaned. Day 9: Decide how you will turn down a cigarette before it is offered to you. Day 10: Review your reasons for quitting. Day 11: Distract yourself. Stay active to keep your mind off smoking and to relieve tension.  Take a walk, exercise, read a book, do a crossword puzzle, or try a new hobby. Day 12: Exercise. Get off the bus before your stop or use stairs instead of escalators. Day 13: Call on friends for support and encouragement. Day 14: Reward yourself! Two weeks without smoking! Day 15: Practice deep breathing exercises. Day 16: Bet a friend that you can stay a nonsmoker. Day 17: Ask to sit in nonsmoking sections of restaurants. Day 18: Hang up "No Smoking" signs. Day 19: Think of yourself as a nonsmoker. Day 20: Each morning, tell yourself you will not smoke. Day 21: Reward yourself! Three weeks without smoking! Day 22: Think of smoking in negative ways. Remember how it stains your teeth, gives you bad breath, and leaves you short of breath. Day 23: Eat a nutritious breakfast. Day 24:Do not relive your days as a smoker. Day 25: Hold a pencil in your  hand when talking on the telephone. Day 26: Tell all your friends you do not smoke. Day 27: Think about how much better food tastes. Day 28: Remember, one cigarette is one too many. Day 29: Take up a hobby that will keep your hands busy. Day 30: Congratulations! One month without smoking! Give yourself a big reward. Your caregiver can direct you to community resources or hospitals for support, which may include:  Group support.  Education.  Hypnosis.  Subliminal therapy. Document Released: 11/02/2003 Document Revised: 04/28/2011 Document Reviewed: 11/20/2008 [facility] Patient Information 2013 [place], [place].

## 2011-12-30 ENCOUNTER — Encounter: Payer: Self-pay | Admitting: Family Medicine

## 2011-12-30 NOTE — Assessment & Plan Note (Signed)
Discussed long term affects of depo > 2 years possible bone mineral density implications. I suggested Mirena as an option, but since she has history of ablation and heavy periods evaluated by [facility], recommend she f/u with GYN to determine if this would be appropriate (patient states she was told would need depo indefinitely).

## 2011-12-30 NOTE — Assessment & Plan Note (Signed)
Cervical ctx sent in setting of +trichomonas.

## 2011-12-30 NOTE — Assessment & Plan Note (Addendum)
Discussed cessation. She is contemplating quit date. rx for zyban given to start one week prior to quit date. F/u one month.

## 2012-01-06 ENCOUNTER — Encounter: Payer: Self-pay | Admitting: Family Medicine

## 2012-01-19 ENCOUNTER — Encounter: Payer: Self-pay | Admitting: Home Health Services

## 2012-03-15 ENCOUNTER — Ambulatory Visit: Payer: Medicaid Other

## 2012-03-17 ENCOUNTER — Ambulatory Visit: Payer: Medicaid Other

## 2012-03-24 ENCOUNTER — Ambulatory Visit (INDEPENDENT_AMBULATORY_CARE_PROVIDER_SITE_OTHER): Payer: Medicaid Other | Admitting: *Deleted

## 2012-03-24 DIAGNOSIS — Z309 Encounter for contraceptive management, unspecified: Secondary | ICD-10-CM

## 2012-03-24 LAB — POCT URINE PREGNANCY: Preg Test, Ur: NEGATIVE

## 2012-03-24 MED ORDER — MEDROXYPROGESTERONE ACETATE 150 MG/ML IM SUSP
150.0000 mg | Freq: Once | INTRAMUSCULAR | Status: AC
  Administered 2012-03-24: 150 mg via INTRAMUSCULAR

## 2012-03-24 NOTE — Progress Notes (Signed)
Patient late for Depo today . Urine pregnancy test done and is negative. Patient states she has not had sex in past two weeks. Depo given today and patient advised to use  extra protection for next 7 days.   Next depo due April 23 through May 7,2014

## 2012-04-21 ENCOUNTER — Ambulatory Visit: Payer: Medicaid Other | Admitting: Family Medicine

## 2012-06-23 ENCOUNTER — Ambulatory Visit (INDEPENDENT_AMBULATORY_CARE_PROVIDER_SITE_OTHER): Payer: Medicaid Other | Admitting: *Deleted

## 2012-06-23 DIAGNOSIS — Z309 Encounter for contraceptive management, unspecified: Secondary | ICD-10-CM

## 2012-06-23 MED ORDER — MEDROXYPROGESTERONE ACETATE 150 MG/ML IM SUSP
150.0000 mg | Freq: Once | INTRAMUSCULAR | Status: AC
  Administered 2012-06-23: 150 mg via INTRAMUSCULAR

## 2012-06-23 NOTE — Progress Notes (Signed)
Pt here for depo. Depo given in left deltoid per pt request. Next depo due July 23-aug 6. Erza Mothershead, Harold Hedge, RN

## 2012-09-20 ENCOUNTER — Ambulatory Visit (INDEPENDENT_AMBULATORY_CARE_PROVIDER_SITE_OTHER): Payer: Medicaid Other | Admitting: *Deleted

## 2012-09-20 DIAGNOSIS — Z309 Encounter for contraceptive management, unspecified: Secondary | ICD-10-CM

## 2012-09-20 MED ORDER — MEDROXYPROGESTERONE ACETATE 150 MG/ML IM SUSP
150.0000 mg | Freq: Once | INTRAMUSCULAR | Status: AC
  Administered 2012-09-20: 150 mg via INTRAMUSCULAR

## 2012-09-20 NOTE — Progress Notes (Signed)
Pt here for depo. Depo given RUOQ . Next depo due oct 20 -nov 3 - reminder card given Wyatt Haste, RN-BSN

## 2012-10-12 ENCOUNTER — Ambulatory Visit: Payer: Medicaid Other

## 2012-12-20 ENCOUNTER — Ambulatory Visit: Payer: Medicaid Other

## 2012-12-23 ENCOUNTER — Ambulatory Visit (INDEPENDENT_AMBULATORY_CARE_PROVIDER_SITE_OTHER): Payer: Medicaid Other | Admitting: *Deleted

## 2012-12-23 DIAGNOSIS — Z309 Encounter for contraceptive management, unspecified: Secondary | ICD-10-CM

## 2012-12-23 LAB — POCT URINE PREGNANCY: Preg Test, Ur: NEGATIVE

## 2012-12-23 MED ORDER — MEDROXYPROGESTERONE ACETATE 150 MG/ML IM SUSP
150.0000 mg | Freq: Once | INTRAMUSCULAR | Status: AC
  Administered 2012-12-23: 150 mg via INTRAMUSCULAR

## 2012-12-23 NOTE — Progress Notes (Signed)
Patient in today for depo. Patient 3 days late so urine pregnancy obtained with negative results. Injection given in left deltoid, no complaints noted, site unremarkable. Next depo due January 22 - February 6, patient aware.

## 2013-03-22 ENCOUNTER — Ambulatory Visit (INDEPENDENT_AMBULATORY_CARE_PROVIDER_SITE_OTHER): Payer: Medicaid Other | Admitting: *Deleted

## 2013-03-22 DIAGNOSIS — Z309 Encounter for contraceptive management, unspecified: Secondary | ICD-10-CM

## 2013-03-22 MED ORDER — MEDROXYPROGESTERONE ACETATE 150 MG/ML IM SUSP
150.0000 mg | Freq: Once | INTRAMUSCULAR | Status: AC
  Administered 2013-03-22: 150 mg via INTRAMUSCULAR

## 2013-06-21 ENCOUNTER — Ambulatory Visit (INDEPENDENT_AMBULATORY_CARE_PROVIDER_SITE_OTHER): Payer: Medicaid Other | Admitting: *Deleted

## 2013-06-21 DIAGNOSIS — Z309 Encounter for contraceptive management, unspecified: Secondary | ICD-10-CM

## 2013-06-21 MED ORDER — MEDROXYPROGESTERONE ACETATE 150 MG/ML IM SUSP
150.0000 mg | Freq: Once | INTRAMUSCULAR | Status: AC
  Administered 2013-06-21: 150 mg via INTRAMUSCULAR

## 2013-06-21 NOTE — Progress Notes (Signed)
   Pt in for Depo Provera injection.  Pt tolerated Depo injection. Depo given Righ upper outer quadrant.  Next injection due July 21 -September 20, 2013.  Reminder card given. Clovis PuMartin, Nathanyal Ashmead L, RN

## 2013-09-19 ENCOUNTER — Ambulatory Visit: Payer: Medicaid Other

## 2013-09-20 ENCOUNTER — Ambulatory Visit (INDEPENDENT_AMBULATORY_CARE_PROVIDER_SITE_OTHER): Payer: Medicaid Other | Admitting: *Deleted

## 2013-09-20 DIAGNOSIS — Z3049 Encounter for surveillance of other contraceptives: Secondary | ICD-10-CM

## 2013-09-20 DIAGNOSIS — Z3042 Encounter for surveillance of injectable contraceptive: Secondary | ICD-10-CM

## 2013-09-20 MED ORDER — MEDROXYPROGESTERONE ACETATE 150 MG/ML IM SUSP
150.0000 mg | Freq: Once | INTRAMUSCULAR | Status: AC
  Administered 2013-09-20: 150 mg via INTRAMUSCULAR

## 2013-09-20 NOTE — Progress Notes (Signed)
   Pt in for Depo Provera injection.  Pt tolerated Depo injection. Depo given left upper outer quadrant.  Next injection due Oct 20-Dec 20, 2013.  Reminder card given. Zettie Gootee L, RN   

## 2013-10-31 ENCOUNTER — Encounter (HOSPITAL_COMMUNITY): Payer: Self-pay | Admitting: Emergency Medicine

## 2013-10-31 ENCOUNTER — Emergency Department (HOSPITAL_COMMUNITY)
Admission: EM | Admit: 2013-10-31 | Discharge: 2013-10-31 | Disposition: A | Discharge status: Home / Self Care | Payer: No Typology Code available for payment source | Attending: Emergency Medicine | Admitting: Emergency Medicine

## 2013-10-31 DIAGNOSIS — Y9389 Activity, other specified: Secondary | ICD-10-CM | POA: Insufficient documentation

## 2013-10-31 DIAGNOSIS — F17200 Nicotine dependence, unspecified, uncomplicated: Secondary | ICD-10-CM | POA: Insufficient documentation

## 2013-10-31 DIAGNOSIS — Z8659 Personal history of other mental and behavioral disorders: Secondary | ICD-10-CM | POA: Insufficient documentation

## 2013-10-31 DIAGNOSIS — Y92410 Unspecified street and highway as the place of occurrence of the external cause: Secondary | ICD-10-CM | POA: Insufficient documentation

## 2013-10-31 DIAGNOSIS — S0990XA Unspecified injury of head, initial encounter: Secondary | ICD-10-CM | POA: Diagnosis present

## 2013-10-31 DIAGNOSIS — R51 Headache: Secondary | ICD-10-CM

## 2013-10-31 DIAGNOSIS — Z79899 Other long term (current) drug therapy: Secondary | ICD-10-CM | POA: Diagnosis not present

## 2013-10-31 DIAGNOSIS — R42 Dizziness and giddiness: Secondary | ICD-10-CM | POA: Insufficient documentation

## 2013-10-31 DIAGNOSIS — R519 Headache, unspecified: Secondary | ICD-10-CM

## 2013-10-31 MED ORDER — HYDROCODONE-ACETAMINOPHEN 5-325 MG PO TABS: 1.0000 | ORAL_TABLET | ORAL | Status: DC | PRN

## 2013-10-31 MED ORDER — HYDROCODONE-ACETAMINOPHEN 5-325 MG PO TABS
1.0000 | ORAL_TABLET | Freq: Once | ORAL | Status: AC
  Administered 2013-10-31: 1 via ORAL
  Filled 2013-10-31: qty 1

## 2013-10-31 MED ORDER — IBUPROFEN 600 MG PO TABS: 600.0000 mg | ORAL_TABLET | Freq: Four times a day (QID) | ORAL | Status: DC | PRN

## 2013-10-31 NOTE — ED Notes (Signed)
Discharge instructions reviewed with pt. Pt verbalized understanding.   
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
## 2013-12-12 ENCOUNTER — Ambulatory Visit (INDEPENDENT_AMBULATORY_CARE_PROVIDER_SITE_OTHER): Payer: Medicaid Other | Admitting: *Deleted

## 2013-12-12 DIAGNOSIS — Z3042 Encounter for surveillance of injectable contraceptive: Secondary | ICD-10-CM

## 2013-12-12 MED ORDER — MEDROXYPROGESTERONE ACETATE 150 MG/ML IM SUSP
150.0000 mg | Freq: Once | INTRAMUSCULAR | Status: AC
  Administered 2013-12-12: 150 mg via INTRAMUSCULAR

## 2013-12-12 NOTE — Progress Notes (Signed)
   Pt in for Depo Provera injection.  Pt tolerated Depo injection. Depo given right upper outer quadrant.  Next injection due Jan 11-Mar 13, 2014.  Reminder card given. Clovis PuMartin, Scotty Weigelt L, RN

## 2014-03-14 ENCOUNTER — Ambulatory Visit (INDEPENDENT_AMBULATORY_CARE_PROVIDER_SITE_OTHER): Payer: Medicaid Other | Admitting: *Deleted

## 2014-03-14 DIAGNOSIS — Z3042 Encounter for surveillance of injectable contraceptive: Secondary | ICD-10-CM

## 2014-03-14 LAB — POCT URINE PREGNANCY: Preg Test, Ur: NEGATIVE

## 2014-03-14 MED ORDER — MEDROXYPROGESTERONE ACETATE 150 MG/ML IM SUSP
150.0000 mg | Freq: Once | INTRAMUSCULAR | Status: AC
  Administered 2014-03-14: 150 mg via INTRAMUSCULAR

## 2014-03-14 NOTE — Progress Notes (Signed)
   Pt a day late for Depo Provera injection. Pregnancy test ordered; results negative.  Pt tolerated Depo injection. Depo given left upper outer quadrant.  Next injection due April 13-June 14, 2014 .  Reminder card given. Clovis PuMartin, Tamika L, RN

## 2014-05-26 ENCOUNTER — Emergency Department (INDEPENDENT_AMBULATORY_CARE_PROVIDER_SITE_OTHER)
Admission: EM | Admit: 2014-05-26 | Discharge: 2014-05-26 | Disposition: A | Discharge status: Home / Self Care | Payer: PRIVATE HEALTH INSURANCE | Source: Home / Self Care | Attending: Emergency Medicine | Admitting: Emergency Medicine

## 2014-05-26 ENCOUNTER — Encounter (HOSPITAL_COMMUNITY): Payer: Self-pay | Admitting: Emergency Medicine

## 2014-05-26 DIAGNOSIS — L02413 Cutaneous abscess of right upper limb: Secondary | ICD-10-CM

## 2014-05-26 MED ORDER — TRAMADOL HCL 50 MG PO TABS: 50.0000 mg | ORAL_TABLET | Freq: Four times a day (QID) | ORAL | Status: DC | PRN

## 2014-05-26 MED ORDER — SULFAMETHOXAZOLE-TRIMETHOPRIM 800-160 MG PO TABS: 1.0000 | ORAL_TABLET | Freq: Two times a day (BID) | ORAL | Status: AC

## 2014-05-26 NOTE — ED Provider Notes (Signed)
CSN: [id number]     Arrival date & time 05/26/14  1936 History   First MD Initiated Contact with Patient 05/26/14 2017     Chief Complaint  Patient presents with  . Abscess   (Consider location/radiation/quality/duration/timing/severity/associated sxs/prior Treatment) HPI  She is a 34 year old woman here for evaluation of abscess. She states she had a bug bite on Saturday on her right forearm. This is very itchy and she scratched at it. She then developed swelling and redness. In the last 2-3 days it has started draining. It remains very tender and swollen. She denies any fevers or nausea.  Past Medical History  Diagnosis Date  . Depression    Past Surgical History  Procedure Laterality Date  . Endometrial ablation     Family History  Problem Relation Age of Onset  . Cancer Mother 2045    breast cancer  . Diabetes Maternal Grandmother   . Diabetes Paternal Grandmother    History  Substance Use Topics  . Smoking status: Current Every Day Smoker -- 1.00 packs/day    Types: Cigarettes  . Smokeless tobacco: Not on file     Comment: smoke black n mild a pack every 2 days  . Alcohol Use: No   OB History    No data available     Review of Systems As in history of present illness Allergies  Review of patient's allergies indicates no known allergies.  Home Medications   Prior to Admission medications   Medication Sig Start Date End Date Taking? Authorizing Provider  HYDROcodone-acetaminophen (NORCO/VICODIN) 5-325 MG per tablet Take 1 tablet by mouth every 4 (four) hours as needed for moderate pain or severe pain. 10/31/13   Shon Batonourtney F Horton, MD  ibuprofen (ADVIL,MOTRIN) 600 MG tablet Take 1 tablet (600 mg total) by mouth every 6 (six) hours as needed. 10/31/13   Shon Batonourtney F Horton, MD  medroxyPROGESTERone (DEPO-PROVERA) 150 MG/ML injection Inject 1 mL (150 mg total) into the muscle every 3 (three) months. 06/28/10   Olivia MackieAmber B Strother, MD  sulfamethoxazole-trimethoprim (BACTRIM  DS,SEPTRA DS) 800-160 MG per tablet Take 1 tablet by mouth 2 (two) times daily. 05/26/14 06/02/14  Charm RingsErin J Krystle Oberman, MD  traMADol (ULTRAM) 50 MG tablet Take 1 tablet (50 mg total) by mouth every 6 (six) hours as needed. 05/26/14   Charm RingsErin J Briann Sarchet, MD   BP 114/84 mmHg  Pulse 80  Temp(Src) 98.1 F (36.7 C) (Oral)  Resp 20  SpO2 96% Physical Exam  Constitutional: She is oriented to person, place, and time. She appears well-developed and well-nourished. No distress.  Cardiovascular: Normal rate.   Pulmonary/Chest: Effort normal.  Neurological: She is alert and oriented to person, place, and time.  Skin:  She has a 2 cm abscess with central abrasion and 2 small areas of clear drainage. There is some mild surrounding swelling and erythema.    ED Course  Procedures (including critical care time) Labs Review Labs Reviewed - No data to display  Imaging Review No results found.   MDM   1. Abscess of right arm    We'll treat with Bactrim for 7 days. Recommended warm compresses. Tylenol and ibuprofen as needed for pain. Prescription for tramadol provided to use as needed for severe pain. Follow-up in 3 days if no improvement.    Charm RingsErin J Taniyah Ballow, MD 05/26/14 2055

## 2014-05-26 NOTE — ED Notes (Signed)
Patient c/o abcess on right posterior forearm x 6 days. Patient reports she thought it was a spider bite. She has been putting peroxide on it and keeping it bandaged. Patient is in NAD.

## 2014-05-26 NOTE — Discharge Instructions (Signed)
You have an infection on your arm. Take Bactrim 1 pill twice a day for 7 days. Apply warm compresses as often as you can. You can take Benadryl for the itching. If it is not getting better by Monday, please come back. You can go back to work on Sunday. Keep the wound covered.

## 2014-06-15 ENCOUNTER — Ambulatory Visit (INDEPENDENT_AMBULATORY_CARE_PROVIDER_SITE_OTHER): Payer: Medicaid Other | Admitting: *Deleted

## 2014-06-15 DIAGNOSIS — Z3049 Encounter for surveillance of other contraceptives: Secondary | ICD-10-CM | POA: Diagnosis present

## 2014-06-15 LAB — POCT URINE PREGNANCY: Preg Test, Ur: NEGATIVE

## 2014-06-15 MED ORDER — MEDROXYPROGESTERONE ACETATE 150 MG/ML IM SUSP
150.0000 mg | Freq: Once | INTRAMUSCULAR | Status: AC
  Administered 2014-06-15: 150 mg via INTRAMUSCULAR

## 2014-06-15 NOTE — Progress Notes (Signed)
Pt arrives today for depo shot.  She is 1 day late (was due 4/13 - 4/27).  Attempted to call yesterday to have her come in but no answer, pt states she was at work.  Discussed with pt policy.  Pt denies being sexually active at all in the last 2 weeks, given negative upreg will give depo today.  She will be due again July 14-July 28. Zahria Ding, Maryjo RochesterJessica Dawn

## 2014-07-14 ENCOUNTER — Encounter: Payer: Medicaid Other | Admitting: Family Medicine

## 2014-09-11 ENCOUNTER — Ambulatory Visit (INDEPENDENT_AMBULATORY_CARE_PROVIDER_SITE_OTHER): Payer: Medicaid Other | Admitting: *Deleted

## 2014-09-11 DIAGNOSIS — Z3042 Encounter for surveillance of injectable contraceptive: Secondary | ICD-10-CM

## 2014-09-11 MED ORDER — MEDROXYPROGESTERONE ACETATE 150 MG/ML IM SUSP
150.0000 mg | Freq: Once | INTRAMUSCULAR | Status: AC
  Administered 2014-09-11: 150 mg via INTRAMUSCULAR

## 2014-09-11 NOTE — Progress Notes (Signed)
  Pt in for Depo Provera. Given Rt upper [employer], next injection due 11/27/2014-12/11/2014. Reminder card given. Sunday Spillers, CMA

## 2014-09-15 ENCOUNTER — Ambulatory Visit: Payer: Medicaid Other | Admitting: Family Medicine

## 2014-11-16 ENCOUNTER — Encounter (HOSPITAL_COMMUNITY): Payer: Self-pay | Admitting: Emergency Medicine

## 2014-11-16 ENCOUNTER — Emergency Department (INDEPENDENT_AMBULATORY_CARE_PROVIDER_SITE_OTHER)
Admission: EM | Admit: 2014-11-16 | Discharge: 2014-11-16 | Disposition: A | Discharge status: Home / Self Care | Payer: PRIVATE HEALTH INSURANCE | Source: Home / Self Care | Attending: Family Medicine | Admitting: Family Medicine

## 2014-11-16 DIAGNOSIS — K047 Periapical abscess without sinus: Secondary | ICD-10-CM | POA: Diagnosis not present

## 2014-11-16 DIAGNOSIS — K088 Other specified disorders of teeth and supporting structures: Secondary | ICD-10-CM

## 2014-11-16 DIAGNOSIS — K0889 Other specified disorders of teeth and supporting structures: Secondary | ICD-10-CM

## 2014-11-16 MED ORDER — HYDROCODONE-ACETAMINOPHEN 5-325 MG PO TABS: 1.0000 | ORAL_TABLET | ORAL | Status: DC | PRN

## 2014-11-16 MED ORDER — AMOXICILLIN 500 MG PO CAPS: 1000.0000 mg | ORAL_CAPSULE | Freq: Two times a day (BID) | ORAL | Status: DC

## 2014-11-16 NOTE — ED Provider Notes (Signed)
CSN: [id number]     Arrival date & time 11/16/14  1304 History   First MD Initiated Contact with Patient 11/16/14 1350     Chief Complaint  Patient presents with  . Dental Pain   (Consider location/radiation/quality/duration/timing/severity/associated sxs/prior Treatment) HPI Comments: 34 year old female complaining of toothache for approximately 9 days. She has a toothache in the left lower third molar as well as the upper left bicuspid. She has not called the dentist. She has not seen her PCP. The pain hurts like a "toothache".   Past Medical History  Diagnosis Date  . Depression    Past Surgical History  Procedure Laterality Date  . Endometrial ablation     Family History  Problem Relation Age of Onset  . Cancer Mother 35    breast cancer  . Diabetes Maternal Grandmother   . Diabetes Paternal Grandmother    Social History  Substance Use Topics  . Smoking status: Current Every Day Smoker -- 1.00 packs/day    Types: Cigarettes  . Smokeless tobacco: None     Comment: smoke black n mild a pack every 2 days  . Alcohol Use: No   OB History    No data available     Review of Systems  Constitutional: Negative.   HENT: Positive for dental problem.   All other systems reviewed and are negative.   Allergies  Review of patient's allergies indicates no known allergies.  Home Medications   Prior to Admission medications   Medication Sig Start Date End Date Taking? Authorizing Provider  amoxicillin (AMOXIL) 500 MG capsule Take 2 capsules (1,000 mg total) by mouth 2 (two) times daily. 11/16/14   Hayden Rasmussen, NP  HYDROcodone-acetaminophen (NORCO/VICODIN) 5-325 MG tablet Take 1 tablet by mouth every 4 (four) hours as needed. 11/16/14   Hayden Rasmussen, NP  ibuprofen (ADVIL,MOTRIN) 600 MG tablet Take 1 tablet (600 mg total) by mouth every 6 (six) hours as needed. 10/31/13   Shon Baton, MD  medroxyPROGESTERone (DEPO-PROVERA) 150 MG/ML injection Inject 1 mL (150 mg total) into the  muscle every 3 (three) months. 06/28/10   Olivia Mackie, MD  traMADol (ULTRAM) 50 MG tablet Take 1 tablet (50 mg total) by mouth every 6 (six) hours as needed. 05/26/14   Charm Rings, MD   Meds Ordered and Administered this Visit  Medications - No data to display  BP 106/78 mmHg  Pulse 84  Temp(Src) 98 F (36.7 C) (Oral)  Resp 18  SpO2 99%  LMP 10/19/2014 No data found.   Physical Exam  Constitutional: She is oriented to person, place, and time. She appears well-developed and well-nourished. No distress.  HENT:  Head: Normocephalic and atraumatic.  Mouth/Throat: Oropharynx is clear and moist. No oropharyngeal exudate.  [employer] Engineer, manufacturing systems. The left lower third molar is eroded and indurated. There is minor adjacent gingival erythema and buccal swelling.  The upper left bicuspid is with dental tenderness but no adjacent or surrounding swelling, erythema or other signs of infection or abscess.  Neck: Normal range of motion. Neck supple.  Pulmonary/Chest: Effort normal. No respiratory distress.  Musculoskeletal: She exhibits no edema.  Neurological: She is alert and oriented to person, place, and time. She exhibits normal muscle tone.  Skin: Skin is warm and dry.  Nursing note and vitals reviewed.   ED Course  Procedures (including critical care time)  Labs Review Labs Reviewed - No data to display  Imaging Review No results found.   Visual Acuity Review  Right Eye Distance:   Left Eye Distance:   Bilateral Distance:    Right Eye Near:   Left Eye Near:    Bilateral Near:         MDM   1. Pain, dental   2. Dental infection    Amoxicillin as dir norco 5 mg #15 Must see dentist ASAP    Hayden Rasmussen, NP 11/16/14 1408

## 2014-11-16 NOTE — Discharge Instructions (Signed)
Dental Pain °A tooth ache may be caused by cavities (tooth decay). Cavities expose the nerve of the tooth to air and hot or cold temperatures. It may come from an infection or abscess (also called a boil or furuncle) around your tooth. It is also often caused by dental caries (tooth decay). This causes the pain you are having. °DIAGNOSIS  °Your caregiver can diagnose this problem by exam. °TREATMENT  °· If caused by an infection, it may be treated with medications which kill germs (antibiotics) and pain medications as prescribed by your caregiver. Take medications as directed. °· Only take over-the-counter or prescription medicines for pain, discomfort, or fever as directed by your caregiver. °· Whether the tooth ache today is caused by infection or dental disease, you should see your dentist as soon as possible for further care. °SEEK MEDICAL CARE IF: °The exam and treatment you received today has been provided on an emergency basis only. This is not a substitute for complete medical or dental care. If your problem worsens or new problems (symptoms) appear, and you are unable to meet with your dentist, call or return to this location. °SEEK IMMEDIATE MEDICAL CARE IF:  °· You have a fever. °· You develop redness and swelling of your face, jaw, or neck. °· You are unable to open your mouth. °· You have severe pain uncontrolled by pain medicine. °MAKE SURE YOU:  °· Understand these instructions. °· Will watch your condition. °· Will get help right away if you are not doing well or get worse. °Document Released: 02/03/2005 Document Revised: 04/28/2011 Document Reviewed: 09/22/2007 °[employer]® Patient Information ©2015 [employer]. This information is not intended to replace advice given to you by your health care provider. Make sure you discuss any questions you have with your health care provider. ° °Dental Care and Dentist Visits °Dental care supports good overall health. Regular dental visits can also help you  avoid dental pain, bleeding, infection, and other more serious health problems in the future. It is important to keep the mouth healthy because diseases in the teeth, gums, and other oral tissues can spread to other areas of the body. Some problems, such as diabetes, heart disease, and pre-term labor have been associated with poor oral health.  °See your dentist every 6 months. If you experience emergency problems such as a toothache or broken tooth, go to the dentist right away. If you see your dentist regularly, you may catch problems early. It is easier to be treated for problems in the early stages.  °WHAT TO EXPECT AT A DENTIST VISIT  °Your dentist will look for many common oral health problems and recommend proper treatment. At your regular dental visit, you can expect: °· Gentle cleaning of the teeth and gums. This includes scraping and polishing. This helps to remove the sticky substance around the teeth and gums (plaque). Plaque forms in the mouth shortly after eating. Over time, plaque hardens on the teeth as tartar. If tartar is not removed regularly, it can cause problems. Cleaning also helps remove stains. °· Periodic X-rays. These pictures of the teeth and supporting bone will help your dentist assess the health of your teeth. °· Periodic fluoride treatments. Fluoride is a natural mineral shown to help strengthen teeth. Fluoride treatment involves applying a fluoride gel or varnish to the teeth. It is most commonly done in children. °· Examination of the mouth, tongue, jaws, teeth, and gums to look for any oral health problems, such as: °¨ Cavities (dental caries). This is   decay on the tooth caused by plaque, sugar, and acid in the mouth. It is best to catch a cavity when it is small. °¨ Inflammation of the gums caused by plaque buildup (gingivitis). °¨ Problems with the mouth or malformed or misaligned teeth. °¨ Oral cancer or other diseases of the soft tissues or jaws.  °KEEP YOUR TEETH AND GUMS  HEALTHY °For healthy teeth and gums, follow these general guidelines as well as your dentist's specific advice: °· Have your teeth professionally cleaned at the dentist every 6 months. °· Brush twice daily with a fluoride toothpaste. °· Floss your teeth daily.  °· Ask your dentist if you need fluoride supplements, treatments, or fluoride toothpaste. °· Eat a healthy diet. Reduce foods and drinks with added sugar. °· Avoid smoking. °TREATMENT FOR ORAL HEALTH PROBLEMS °If you have oral health problems, treatment varies depending on the conditions present in your teeth and gums. °· Your caregiver will most likely recommend good oral hygiene at each visit. °· For cavities, gingivitis, or other oral health disease, your caregiver will perform a procedure to treat the problem. This is typically done at a separate appointment. Sometimes your caregiver will refer you to another dental specialist for specific tooth problems or for surgery. °SEEK IMMEDIATE DENTAL CARE IF: °· You have pain, bleeding, or soreness in the gum, tooth, jaw, or mouth area. °· A permanent tooth becomes loose or separated from the gum socket. °· You experience a blow or injury to the mouth or jaw area. °Document Released: 10/16/2010 Document Revised: 04/28/2011 Document Reviewed: 10/16/2010 °ExitCare® Patient Information ©2015 [employer]. This information is not intended to replace advice given to you by your health care provider. Make sure you discuss any questions you have with your health care provider. ° °

## 2014-11-16 NOTE — ED Notes (Signed)
Dental pain, onset last Tuesday, 9/27.  Reports left top and left bottom teeth hurting.

## 2014-12-01 ENCOUNTER — Ambulatory Visit: Payer: PRIVATE HEALTH INSURANCE | Admitting: Family Medicine

## 2014-12-04 ENCOUNTER — Emergency Department (HOSPITAL_COMMUNITY)
Admission: EM | Admit: 2014-12-04 | Discharge: 2014-12-04 | Disposition: A | Discharge status: Home / Self Care | Payer: PRIVATE HEALTH INSURANCE | Attending: Emergency Medicine | Admitting: Emergency Medicine

## 2014-12-04 ENCOUNTER — Encounter (HOSPITAL_COMMUNITY): Payer: Self-pay | Admitting: *Deleted

## 2014-12-04 DIAGNOSIS — Z8659 Personal history of other mental and behavioral disorders: Secondary | ICD-10-CM | POA: Diagnosis not present

## 2014-12-04 DIAGNOSIS — R220 Localized swelling, mass and lump, head: Secondary | ICD-10-CM | POA: Diagnosis not present

## 2014-12-04 DIAGNOSIS — K0889 Other specified disorders of teeth and supporting structures: Secondary | ICD-10-CM

## 2014-12-04 DIAGNOSIS — Z720 Tobacco use: Secondary | ICD-10-CM | POA: Diagnosis not present

## 2014-12-04 DIAGNOSIS — Z792 Long term (current) use of antibiotics: Secondary | ICD-10-CM | POA: Insufficient documentation

## 2014-12-04 DIAGNOSIS — K0381 Cracked tooth: Secondary | ICD-10-CM | POA: Insufficient documentation

## 2014-12-04 MED ORDER — TRAMADOL HCL 50 MG PO TABS
50.0000 mg | ORAL_TABLET | Freq: Once | ORAL | Status: AC
  Administered 2014-12-04: 50 mg via ORAL
  Filled 2014-12-04: qty 1

## 2014-12-04 MED ORDER — TRAMADOL HCL 50 MG PO TABS: 50.0000 mg | ORAL_TABLET | Freq: Four times a day (QID) | ORAL | Status: DC | PRN

## 2014-12-04 NOTE — Discharge Instructions (Signed)
[facility]. Return for fever, facial swelling, difficulty opening her mouth. Take ibuprofen for pain and Ultram for breakthrough pain.

## 2014-12-04 NOTE — ED Provider Notes (Signed)
CSN: [id number]     Arrival date & time 12/04/14  [id number] History   First MD Initiated Contact with Patient 12/04/14 [phone]     Chief Complaint  Patient presents with  . Oral Swelling     (Consider location/radiation/quality/duration/timing/severity/associated sxs/prior Treatment) HPI [name] is a 34 year old female with a history of depression who presents for constant left-sided dental pain that began over 3 weeks ago. She states she's been taking Tylenol and BC powder with minimal relief. She states it is worse with chewing. She states she has not seen a dentist due to insurance reasons.  He denies any fever, chills, drooling, difficulty opening her mouth.   Past Medical History  Diagnosis Date  . Depression    Past Surgical History  Procedure Laterality Date  . Endometrial ablation     Family History  Problem Relation Age of Onset  . Cancer Mother 645    breast cancer  . Diabetes Maternal Grandmother   . Diabetes Paternal Grandmother    Social History  Substance Use Topics  . Smoking status: Current Every Day Smoker -- 1.00 packs/day    Types: Cigarettes  . Smokeless tobacco: None     Comment: smoke black n mild a pack every 2 days  . Alcohol Use: No   OB History    No data available     Review of Systems  Constitutional: Negative for fever and chills.  HENT: Positive for dental problem. Negative for drooling and facial swelling.       Allergies  Review of patient's allergies indicates no known allergies.  Home Medications   Prior to Admission medications   Medication Sig Start Date End Date Taking? Authorizing Provider  amoxicillin (AMOXIL) 500 MG capsule Take 2 capsules (1,000 mg total) by mouth 2 (two) times daily. 11/16/14   Hayden Rasmussenavid Mabe, NP  HYDROcodone-acetaminophen (NORCO/VICODIN) 5-325 MG tablet Take 1 tablet by mouth every 4 (four) hours as needed. 11/16/14   Hayden Rasmussenavid Mabe, NP  ibuprofen (ADVIL,MOTRIN) 600 MG tablet Take 1 tablet (600 mg total) by  mouth every 6 (six) hours as needed. 10/31/13   Shon Batonourtney F Horton, MD  medroxyPROGESTERone (DEPO-PROVERA) 150 MG/ML injection Inject 1 mL (150 mg total) into the muscle every 3 (three) months. 06/28/10   Olivia MackieAmber B Strother, MD  traMADol (ULTRAM) 50 MG tablet Take 1 tablet (50 mg total) by mouth every 6 (six) hours as needed. 12/04/14   Bryana Froemming Patel-Mills, PA-C   BP 109/74 mmHg  Pulse 78  Temp(Src) 98.3 F (36.8 C) (Oral)  Resp 16  Ht 5\' 3"  (1.6 m)  Wt 160 lb (72.576 kg)  BMI 28.35 kg/m2  SpO2 100%  LMP 10/19/2014 Physical Exam  Constitutional: She is oriented to person, place, and time. She appears well-developed and well-nourished.  HENT:  Head: Normocephalic and atraumatic.  Mouth/Throat: Oropharynx is clear and moist and mucous membranes are normal.    No facial or gum swelling. No signs of erythema or dental abscess. No anterior cervical swelling or lymphadenopathy. No drooling or trismus.  Eyes: Conjunctivae are normal.  Neck: Normal range of motion. Neck supple.  Cardiovascular: Normal rate.   Pulmonary/Chest: Effort normal.  Abdominal: Soft.  Musculoskeletal: Normal range of motion.  Neurological: She is alert and oriented to person, place, and time.  Skin: Skin is warm and dry.  Psychiatric: She has a normal mood and affect. Her behavior is normal.  Nursing note and vitals reviewed.   ED Course  Procedures (including critical care time) Labs  Review Labs Reviewed - No data to display  Imaging Review No results found.   EKG Interpretation None      MDM   Final diagnoses:  Pain, dental  Patient presents for dental pain that has been ongoing for the past 3 weeks. She was seen here 3 weeks ago and prescribed amoxicillin and Norco. She has no signs of Ludwig's angina or dental abscess. I do not believe that she needs antibiotics again. I also discussed that she would need to follow-up with a dentist and gave her a referral.  I explained that I would not be giving her  Norco and that I would give her #15 tramadol until she was able to see a dentist. Filed Vitals:   12/04/14 0830  BP: 109/74  Pulse: 78  Temp: 98.3 F (36.8 C)  Resp: [address] PA-C 12/04/14 1610  Blane Ohara, MD 12/06/14 [phone]

## 2014-12-04 NOTE — ED Notes (Signed)
Declined W/C at D/C and was escorted to lobby by RN. 

## 2014-12-04 NOTE — ED Notes (Signed)
PT reports swelling to LT side of face with dental pain on Left side .

## 2014-12-25 ENCOUNTER — Encounter (HOSPITAL_COMMUNITY): Payer: Self-pay | Admitting: *Deleted

## 2014-12-25 ENCOUNTER — Emergency Department (HOSPITAL_COMMUNITY)
Admission: EM | Admit: 2014-12-25 | Discharge: 2014-12-25 | Disposition: A | Discharge status: Home / Self Care | Payer: PRIVATE HEALTH INSURANCE | Attending: Emergency Medicine | Admitting: Emergency Medicine

## 2014-12-25 DIAGNOSIS — Z720 Tobacco use: Secondary | ICD-10-CM | POA: Insufficient documentation

## 2014-12-25 DIAGNOSIS — K0889 Other specified disorders of teeth and supporting structures: Secondary | ICD-10-CM | POA: Insufficient documentation

## 2014-12-25 DIAGNOSIS — Z793 Long term (current) use of hormonal contraceptives: Secondary | ICD-10-CM | POA: Insufficient documentation

## 2014-12-25 DIAGNOSIS — Z792 Long term (current) use of antibiotics: Secondary | ICD-10-CM | POA: Insufficient documentation

## 2014-12-25 DIAGNOSIS — Z87828 Personal history of other (healed) physical injury and trauma: Secondary | ICD-10-CM | POA: Insufficient documentation

## 2014-12-25 DIAGNOSIS — K029 Dental caries, unspecified: Secondary | ICD-10-CM | POA: Insufficient documentation

## 2014-12-25 DIAGNOSIS — Z8659 Personal history of other mental and behavioral disorders: Secondary | ICD-10-CM | POA: Insufficient documentation

## 2014-12-25 MED ORDER — HYDROCODONE-ACETAMINOPHEN 5-325 MG PO TABS: 2.0000 | ORAL_TABLET | ORAL | Status: DC | PRN

## 2014-12-25 MED ORDER — KETOROLAC TROMETHAMINE 30 MG/ML IJ SOLN
30.0000 mg | Freq: Once | INTRAMUSCULAR | Status: AC
  Administered 2014-12-25: 30 mg via INTRAMUSCULAR
  Filled 2014-12-25: qty 1

## 2014-12-25 NOTE — ED Provider Notes (Signed)
CSN: [id number]     Arrival date & time 12/25/14  1038 History  By signing my name below, I, Essence Howell, attest that this documentation has been prepared under the direction and in the presence of Catha Gosselin, PA-C Electronically Signed: Charline Bills, ED Scribe 12/26/2014 at 11:46 AM.   Chief Complaint  Patient presents with  . Dental Pain   The history is provided by the patient. No language interpreter was used.   HPI Comments: AMARYS SLIWINSKI is a 34 y.o. female who presents to the Emergency Department complaining of constant left lower dental pain for the past month. Pt was seen on 9/29 by [facility] and given 15 Norco and Amoxicillin. She was also seen on 10/17 and given 15 Tramadol and referral to dentist. She reports worsened pain since she was last seen that radiates along the left side of her face. Pt denies recent fever. She has taken 3-4 bottles of Tylenol within 3 weeks without relief.   Past Medical History  Diagnosis Date  . Depression    Past Surgical History  Procedure Laterality Date  . Endometrial ablation     Family History  Problem Relation Age of Onset  . Cancer Mother 38    breast cancer  . Diabetes Maternal Grandmother   . Diabetes Paternal Grandmother    Social History  Substance Use Topics  . Smoking status: Current Every Day Smoker -- 1.00 packs/day    Types: Cigarettes  . Smokeless tobacco: None     Comment: smoke black n mild a pack every 2 days  . Alcohol Use: No   OB History    No data available     Review of Systems  Constitutional: Negative for fever.  HENT: Positive for dental problem.    Allergies  Review of patient's allergies indicates no known allergies.  Home Medications   Prior to Admission medications   Medication Sig Start Date End Date Taking? Authorizing Provider  amoxicillin (AMOXIL) 500 MG capsule Take 2 capsules (1,000 mg total) by mouth 2 (two) times daily. 11/16/14   Hayden Rasmussen, NP   HYDROcodone-acetaminophen (NORCO/VICODIN) 5-325 MG tablet Take 2 tablets by mouth every 4 (four) hours as needed. 12/25/14   Marvette Schamp Patel-Mills, PA-C  ibuprofen (ADVIL,MOTRIN) 600 MG tablet Take 1 tablet (600 mg total) by mouth every 6 (six) hours as needed. 10/31/13   Shon Baton, MD  medroxyPROGESTERone (DEPO-PROVERA) 150 MG/ML injection Inject 1 mL (150 mg total) into the muscle every 3 (three) months. 06/28/10   Olivia Mackie, MD  traMADol (ULTRAM) 50 MG tablet Take 1 tablet (50 mg total) by mouth every 6 (six) hours as needed. 12/04/14   Camp Gopal Patel-Mills, PA-C   BP 124/89 mmHg  Pulse 78  Temp(Src) 98.3 F (36.8 C) (Oral)  Resp 20  SpO2 100%  LMP 12/02/2014 Physical Exam  Constitutional: She is oriented to person, place, and time. She appears well-developed and well-nourished. No distress.  HENT:  Head: Normocephalic and atraumatic.  Mouth/Throat:    Eyes: Conjunctivae and EOM are normal.  Neck: Neck supple. No tracheal deviation present.  Cardiovascular: Normal rate.   Pulmonary/Chest: Effort normal. No respiratory distress.  Musculoskeletal: Normal range of motion.  Neurological: She is alert and oriented to person, place, and time.  Skin: Skin is warm and dry.  Psychiatric: She has a normal mood and affect. Her behavior is normal.  Nursing note and vitals reviewed.  ED Course  Procedures (including critical care time) DIAGNOSTIC STUDIES: Oxygen  Saturation is 100% on RA, normal by my interpretation.    COORDINATION OF CARE: 11:42 AM-Discussed treatment plan which includes a dental referral, Norco and ibuprofen with pt at bedside and pt agreed to plan.   Labs Review Labs Reviewed - No data to display  Imaging Review No results found.   EKG Interpretation None      MDM   Final diagnoses:  Pain, dental  Patient presents for dental pain for the past 2 months. She's been evaluated twice, once by family medicine and once in the ED. She is well-appearing and  afebrile. She has no signs of dental infection or lid weeks and angina. I discussed that she would need to follow up with a dentist and that this is becoming chronic in nature. Medications  ketorolac (TORADOL) 30 MG/ML injection 30 mg (30 mg Intramuscular Given 12/25/14 1150)  Rx: Norco #6  I personally performed the services described in this documentation, which was scribed in my presence. The recorded information has been reviewed and is accurate.     Catha GosselinHanna Patel-Mills, PA-C 12/26/14 0754  Laurence Spatesachel Morgan Little, MD 12/27/14 1000

## 2014-12-25 NOTE — ED Notes (Signed)
Pt reports left side dental pain, has been seen for same before but reports no relief with tylenol. Wanting referral to get tooth pulled.

## 2014-12-25 NOTE — ED Notes (Signed)
Declined W/C at D/C and was escorted to lobby by RN. 

## 2014-12-26 ENCOUNTER — Ambulatory Visit (INDEPENDENT_AMBULATORY_CARE_PROVIDER_SITE_OTHER): Payer: PRIVATE HEALTH INSURANCE | Admitting: *Deleted

## 2014-12-26 DIAGNOSIS — Z3042 Encounter for surveillance of injectable contraceptive: Secondary | ICD-10-CM | POA: Diagnosis not present

## 2014-12-26 LAB — POCT URINE PREGNANCY: Preg Test, Ur: NEGATIVE

## 2014-12-26 MED ORDER — MEDROXYPROGESTERONE ACETATE 150 MG/ML IM SUSP
150.0000 mg | Freq: Once | INTRAMUSCULAR | Status: AC
  Administered 2014-12-26: 150 mg via INTRAMUSCULAR

## 2014-12-26 NOTE — Progress Notes (Signed)
  Pt late for Depo Provera injection.  Pregnancy test ordered; results negative.  Pt tolerated Depo injection. Depo given left upper outer quadrant.  Next injection due Jan. 24-Mar 27, 2015.  Reminder card given. Clovis PuMartin, Tamika L, RN

## 2015-03-27 ENCOUNTER — Ambulatory Visit (INDEPENDENT_AMBULATORY_CARE_PROVIDER_SITE_OTHER): Payer: Medicaid Other | Admitting: *Deleted

## 2015-03-27 ENCOUNTER — Encounter: Payer: Self-pay | Admitting: *Deleted

## 2015-03-27 DIAGNOSIS — Z3042 Encounter for surveillance of injectable contraceptive: Secondary | ICD-10-CM

## 2015-03-27 MED ORDER — MEDROXYPROGESTERONE ACETATE 150 MG/ML IM SUSP
150.0000 mg | Freq: Once | INTRAMUSCULAR | Status: AC
  Administered 2015-03-27: 150 mg via INTRAMUSCULAR

## 2015-03-27 NOTE — Progress Notes (Signed)
   Pt in for Depo Provera injection.  Pt tolerated Depo injection. Depo given right upper outer quadrant.  Next injection due April 25-Jun 26, 2015.  Reminder card given. Clovis Pu, RN

## 2015-06-26 ENCOUNTER — Ambulatory Visit (INDEPENDENT_AMBULATORY_CARE_PROVIDER_SITE_OTHER): Payer: Medicaid Other | Admitting: *Deleted

## 2015-06-26 DIAGNOSIS — Z3042 Encounter for surveillance of injectable contraceptive: Secondary | ICD-10-CM | POA: Diagnosis present

## 2015-06-26 MED ORDER — MEDROXYPROGESTERONE ACETATE 150 MG/ML IM SUSP
150.0000 mg | Freq: Once | INTRAMUSCULAR | Status: AC
  Administered 2015-06-26: 150 mg via INTRAMUSCULAR

## 2015-06-26 NOTE — Progress Notes (Signed)
   Pt in for Depo Provera injection.  Pt tolerated Depo injection. Depo given left upper outer quadrant.  Next injection due July 25-September 25, 2015.  Reminder card given. Clovis PuMartin, Tamika L, RN

## 2015-10-05 ENCOUNTER — Ambulatory Visit (INDEPENDENT_AMBULATORY_CARE_PROVIDER_SITE_OTHER): Payer: Medicaid Other | Admitting: *Deleted

## 2015-10-05 DIAGNOSIS — Z3042 Encounter for surveillance of injectable contraceptive: Secondary | ICD-10-CM | POA: Diagnosis present

## 2015-10-05 LAB — POCT URINE PREGNANCY: Preg Test, Ur: NEGATIVE

## 2015-10-05 MED ORDER — MEDROXYPROGESTERONE ACETATE 150 MG/ML IM SUSY
150.0000 mg | PREFILLED_SYRINGE | Freq: Once | INTRAMUSCULAR | Status: AC
  Administered 2015-10-05: 150 mg via INTRAMUSCULAR

## 2015-10-05 NOTE — Progress Notes (Signed)
URINE   

## 2015-10-05 NOTE — Progress Notes (Signed)
Pt is 10 days late for depo.  She had a negative upreg today, but has had unprotected sex in the last 2 weeks.  She will return in 2 weeks for next upreg and was informed and agrees to refrain from unprotected sex for the next 2 weeks.  Next depo due 12/21/15 - 01/04/16 Erubiel Manasco, Maryjo RochesterJessica Dawn, CMA

## 2015-11-10 ENCOUNTER — Emergency Department (HOSPITAL_COMMUNITY): Payer: PRIVATE HEALTH INSURANCE

## 2015-11-10 ENCOUNTER — Emergency Department (HOSPITAL_COMMUNITY)
Admission: EM | Admit: 2015-11-10 | Discharge: 2015-11-10 | Disposition: A | Discharge status: Home / Self Care | Payer: PRIVATE HEALTH INSURANCE | Attending: Physician Assistant | Admitting: Physician Assistant

## 2015-11-10 ENCOUNTER — Encounter (HOSPITAL_COMMUNITY): Payer: Self-pay

## 2015-11-10 DIAGNOSIS — Y939 Activity, unspecified: Secondary | ICD-10-CM | POA: Insufficient documentation

## 2015-11-10 DIAGNOSIS — S3992XA Unspecified injury of lower back, initial encounter: Secondary | ICD-10-CM | POA: Insufficient documentation

## 2015-11-10 DIAGNOSIS — R51 Headache: Secondary | ICD-10-CM | POA: Diagnosis not present

## 2015-11-10 DIAGNOSIS — Y92410 Unspecified street and highway as the place of occurrence of the external cause: Secondary | ICD-10-CM | POA: Diagnosis not present

## 2015-11-10 DIAGNOSIS — F17210 Nicotine dependence, cigarettes, uncomplicated: Secondary | ICD-10-CM | POA: Insufficient documentation

## 2015-11-10 DIAGNOSIS — Y999 Unspecified external cause status: Secondary | ICD-10-CM | POA: Insufficient documentation

## 2015-11-10 LAB — I-STAT BETA HCG BLOOD, ED (MC, WL, AP ONLY): I-stat hCG, quantitative: <5 m[IU]/mL (ref <5)

## 2015-11-10 MED ORDER — IBUPROFEN 400 MG PO TABS: 400.0000 mg | ORAL_TABLET | Freq: Four times a day (QID) | ORAL | 0 refills | Status: DC | PRN

## 2015-11-10 MED ORDER — OXYCODONE-ACETAMINOPHEN 5-325 MG PO TABS
1.0000 | ORAL_TABLET | Freq: Once | ORAL | Status: AC
  Administered 2015-11-10: 1 via ORAL
  Filled 2015-11-10: qty 1

## 2015-11-10 NOTE — ED Notes (Signed)
Patient transported to X-ray 
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
## 2015-12-14 ENCOUNTER — Telehealth: Payer: Self-pay | Admitting: Family Medicine

## 2015-12-21 ENCOUNTER — Ambulatory Visit: Payer: Medicaid Other

## 2015-12-26 ENCOUNTER — Ambulatory Visit (INDEPENDENT_AMBULATORY_CARE_PROVIDER_SITE_OTHER): Payer: Medicaid Other | Admitting: *Deleted

## 2015-12-26 DIAGNOSIS — Z3042 Encounter for surveillance of injectable contraceptive: Secondary | ICD-10-CM | POA: Diagnosis present

## 2015-12-26 MED ORDER — MEDROXYPROGESTERONE ACETATE 150 MG/ML IM SUSP
150.0000 mg | Freq: Once | INTRAMUSCULAR | Status: AC
  Administered 2015-12-26: 150 mg via INTRAMUSCULAR

## 2015-12-26 NOTE — Progress Notes (Signed)
   Pt in for Depo Provera injection.  Pt tolerated Depo injection. Depo given right upper outer quadrant.  Next injection due Jan. 24-Feb. 7, 2018.  Reminder card given. Clovis PuMartin, Tamika L, RN

## 2016-03-12 ENCOUNTER — Ambulatory Visit (INDEPENDENT_AMBULATORY_CARE_PROVIDER_SITE_OTHER): Payer: Medicaid Other | Admitting: *Deleted

## 2016-03-12 DIAGNOSIS — Z3042 Encounter for surveillance of injectable contraceptive: Secondary | ICD-10-CM | POA: Diagnosis present

## 2016-03-12 MED ORDER — MEDROXYPROGESTERONE ACETATE 150 MG/ML IM SUSP
150.0000 mg | Freq: Once | INTRAMUSCULAR | Status: AC
  Administered 2016-03-12: 150 mg via INTRAMUSCULAR

## 2016-03-12 NOTE — Progress Notes (Signed)
   Pt in for Depo Provera injection.  Pt tolerated Depo injection. Depo given left upper outer quadrant.  Next injection due April 11-25, 2018.  Reminder card given. Clovis PuMartin, Tamika L, RN

## 2016-06-13 ENCOUNTER — Ambulatory Visit (INDEPENDENT_AMBULATORY_CARE_PROVIDER_SITE_OTHER): Payer: Medicaid Other | Admitting: *Deleted

## 2016-06-13 DIAGNOSIS — Z3042 Encounter for surveillance of injectable contraceptive: Secondary | ICD-10-CM

## 2016-06-13 LAB — POCT URINE PREGNANCY: Preg Test, Ur: NEGATIVE

## 2016-06-13 MED ORDER — MEDROXYPROGESTERONE ACETATE 150 MG/ML IM SUSP
150.0000 mg | Freq: Once | INTRAMUSCULAR | Status: AC
  Administered 2016-06-13: 150 mg via INTRAMUSCULAR

## 2016-06-13 NOTE — Progress Notes (Signed)
   Pt late for Depo Provera injection.  Pregnancy test ordered; result . Pt tolerated Depo injection. Depo given right upper outer quadrant.  Next injection due July 13-20, 2018.  Reminder card given. Clovis Pu, RN

## 2016-08-19 ENCOUNTER — Ambulatory Visit: Payer: Medicaid Other

## 2016-09-01 ENCOUNTER — Ambulatory Visit (INDEPENDENT_AMBULATORY_CARE_PROVIDER_SITE_OTHER): Payer: Medicaid Other | Admitting: *Deleted

## 2016-09-01 DIAGNOSIS — Z3042 Encounter for surveillance of injectable contraceptive: Secondary | ICD-10-CM

## 2016-09-01 MED ORDER — MEDROXYPROGESTERONE ACETATE 150 MG/ML IM SUSY
150.0000 mg | PREFILLED_SYRINGE | Freq: Once | INTRAMUSCULAR | Status: AC
  Administered 2016-09-01: 150 mg via INTRAMUSCULAR

## 2016-09-01 MED ORDER — MEDROXYPROGESTERONE ACETATE 150 MG/ML IM SUSP: 150.0000 mg | Freq: Once | INTRAMUSCULAR | Status: DC

## 2016-09-01 NOTE — Progress Notes (Signed)
Patient here today for Depo Provera injection.  Depo given today in Left upper outer quadrant.  Site unremarkable & patient tolerated injection.  Next injection due 11/17/16 - 10/15.  Reminder card given.  Julie Hawkins, Maryjo RochesterJessica Dawn, CMA

## 2016-11-19 ENCOUNTER — Ambulatory Visit (INDEPENDENT_AMBULATORY_CARE_PROVIDER_SITE_OTHER): Payer: Self-pay | Admitting: *Deleted

## 2016-11-19 DIAGNOSIS — Z3042 Encounter for surveillance of injectable contraceptive: Secondary | ICD-10-CM

## 2016-11-19 MED ORDER — MEDROXYPROGESTERONE ACETATE 150 MG/ML IM SUSP
150.0000 mg | Freq: Once | INTRAMUSCULAR | Status: AC
  Administered 2016-11-19: 150 mg via INTRAMUSCULAR

## 2016-11-19 NOTE — Progress Notes (Signed)
   Pt in for Depo Provera injection.  Pt tolerated Depo injection. Depo given right upper outer quadrant.  Next injection due February 04, 2017-Jan. 2, 2019.  Reminder card given. Clovis Pu, RN

## 2017-03-12 ENCOUNTER — Ambulatory Visit (INDEPENDENT_AMBULATORY_CARE_PROVIDER_SITE_OTHER): Payer: Medicaid Other | Admitting: *Deleted

## 2017-03-12 DIAGNOSIS — Z3049 Encounter for surveillance of other contraceptives: Secondary | ICD-10-CM | POA: Diagnosis not present

## 2017-03-12 DIAGNOSIS — Z3042 Encounter for surveillance of injectable contraceptive: Secondary | ICD-10-CM

## 2017-03-12 LAB — POCT URINE PREGNANCY: Preg Test, Ur: NEGATIVE

## 2017-03-12 MED ORDER — MEDROXYPROGESTERONE ACETATE 150 MG/ML IM SUSY
150.0000 mg | PREFILLED_SYRINGE | Freq: Once | INTRAMUSCULAR | Status: AC
  Administered 2017-03-12: 150 mg via INTRAMUSCULAR

## 2017-03-12 NOTE — Progress Notes (Signed)
   Patient presents for Depo Provera injection States feeling well Last injection received 11/19/2016  Patient is overdue for injection. Urine pregnancy negative  Depo Provera given IM LUOQ. Patient tolerated well.  Patient states it has been "2 months" since last had unprotected sex Patient aware to use second method of birth control such as condoms and spermicide for next 7 days  Next injection due April 11-25, 2019 Reminder card given  Patient declined flu vaccine today. Encouraged to sched OV with PCP for pap. Fredderick SeveranceUCATTE, LAURENZE L, RN

## 2017-06-10 ENCOUNTER — Ambulatory Visit (INDEPENDENT_AMBULATORY_CARE_PROVIDER_SITE_OTHER): Payer: Medicaid Other

## 2017-06-10 DIAGNOSIS — Z3042 Encounter for surveillance of injectable contraceptive: Secondary | ICD-10-CM | POA: Diagnosis present

## 2017-06-10 MED ORDER — MEDROXYPROGESTERONE ACETATE 150 MG/ML IM SUSY
150.0000 mg | PREFILLED_SYRINGE | Freq: Once | INTRAMUSCULAR | Status: AC
  Administered 2017-06-10: 150 mg via INTRAMUSCULAR

## 2017-06-10 NOTE — Progress Notes (Signed)
Patient here today for Depo Provera injection.  Depo given today RUOQ.  Site unremarkable & patient tolerated injection.  Next injection due July 10-24.  Reminder card given.   Shawna OrleansMeredith B Lizzett Nobile, RN

## 2017-09-22 ENCOUNTER — Ambulatory Visit (INDEPENDENT_AMBULATORY_CARE_PROVIDER_SITE_OTHER): Payer: Medicaid Other

## 2017-09-22 DIAGNOSIS — Z3042 Encounter for surveillance of injectable contraceptive: Secondary | ICD-10-CM | POA: Diagnosis present

## 2017-09-22 LAB — POCT URINE PREGNANCY: Preg Test, Ur: NEGATIVE

## 2017-09-22 MED ORDER — MEDROXYPROGESTERONE ACETATE 150 MG/ML IM SUSY
150.0000 mg | PREFILLED_SYRINGE | Freq: Once | INTRAMUSCULAR | Status: AC
  Administered 2017-09-22: 150 mg via INTRAMUSCULAR

## 2017-09-22 NOTE — Progress Notes (Signed)
   Patient in to nurse clinic not within dates for Depo-Provera injection.  Pregnancy test negative.  Injection given left deltoid at patient request. Next injection due 12/08/17-12/22/17. Reminder card given. Ples SpecterAlisa Brake, RN [facility] RN)

## 2017-09-24 NOTE — Progress Notes (Deleted)
   Subjective   Patient ID: Julie Hawkins    DOB: [birth year], 37 y.o. female   MRN: [id number]  CC: "***"  HPI: Julie LoseSherekia D Bagdasarian is a 37 y.o. female who presents to clinic today for the following:  ***: ***  ROS: see HPI for pertinent.  PMFSH: ***. Smoking status reviewed. Medications reviewed.  Objective   There were no vitals taken for this visit. Vitals and nursing note reviewed.  General: well nourished, well developed, NAD with non-toxic appearance HEENT: normocephalic, atraumatic, moist mucous membranes Neck: supple, non-tender without lymphadenopathy Cardiovascular: regular rate and rhythm without murmurs, rubs, or gallops Lungs: clear to auscultation bilaterally with normal work of breathing Abdomen: soft, non-tender, non-distended, normoactive bowel sounds Skin: warm, dry, no rashes or lesions, cap refill < 2 seconds Extremities: warm and well perfused, normal tone, no edema  Assessment & Plan   No problem-specific Assessment & Plan notes found for this encounter.  No orders of the defined types were placed in this encounter.  No orders of the defined types were placed in this encounter.   Durward Parcelavid Lota Leamer, DO [facility], PGY-3 09/24/2017, 3:45 PM

## 2017-09-25 ENCOUNTER — Ambulatory Visit: Payer: Self-pay | Admitting: Family Medicine

## 2017-12-23 ENCOUNTER — Ambulatory Visit (INDEPENDENT_AMBULATORY_CARE_PROVIDER_SITE_OTHER): Payer: Medicaid Other

## 2017-12-23 DIAGNOSIS — Z3202 Encounter for pregnancy test, result negative: Secondary | ICD-10-CM | POA: Diagnosis not present

## 2017-12-23 DIAGNOSIS — Z3042 Encounter for surveillance of injectable contraceptive: Secondary | ICD-10-CM | POA: Diagnosis not present

## 2017-12-23 LAB — POCT URINE PREGNANCY: Preg Test, Ur: NEGATIVE

## 2017-12-23 MED ORDER — MEDROXYPROGESTERONE ACETATE 150 MG/ML IM SUSY
150.0000 mg | PREFILLED_SYRINGE | Freq: Once | INTRAMUSCULAR | Status: AC
  Administered 2017-12-23: 150 mg via INTRAMUSCULAR

## 2017-12-23 NOTE — Progress Notes (Signed)
   Patient in to nurse clinic not within dates for Depo-Provera injection.  Pregnancy test negative. Reports no unprotected sex. Injection given LUOQ. Next injection due 03/10/18-03/24/18. Reminder card given. Ples Specter, RN [facility] RN)

## 2018-03-22 ENCOUNTER — Ambulatory Visit: Payer: Medicaid Other

## 2018-03-24 ENCOUNTER — Ambulatory Visit (INDEPENDENT_AMBULATORY_CARE_PROVIDER_SITE_OTHER): Payer: Self-pay | Admitting: *Deleted

## 2018-03-24 ENCOUNTER — Telehealth: Payer: Self-pay | Admitting: *Deleted

## 2018-03-24 DIAGNOSIS — Z3042 Encounter for surveillance of injectable contraceptive: Secondary | ICD-10-CM

## 2018-03-24 MED ORDER — MEDROXYPROGESTERONE ACETATE 150 MG/ML IM SUSY
150.0000 mg | PREFILLED_SYRINGE | Freq: Once | INTRAMUSCULAR | Status: AC
  Administered 2018-03-24: 150 mg via INTRAMUSCULAR

## 2018-03-24 NOTE — Progress Notes (Signed)
Patient here today for Depo Provera injection.   Last contraceptive management visit 2013.  Spoke with Dr. Deirdre Priesthambliss (preceptor) will give depo today but next depo MUST BE GIVEN AT VISIT WITH PROVIDER. Appt made with Dr. Abelardo DieselMcMullen for 06/11/18.  Pt informed that if the appt is not kept we can not continue to provide depo.  She is agreeable to plan.  Depo given today in LUOQ.  Site unremarkable & patient tolerated injection.    Next injection due 06/10/18 - 06/24/18.  Reminder card given.    Tiffanni Scarfo, Maryjo RochesterJessica Dawn, CMA

## 2018-03-24 NOTE — Telephone Encounter (Signed)
Opened in Error. Tina Gruner, Maryjo Rochester, CMA

## 2018-06-10 NOTE — Progress Notes (Signed)
  Subjective:   Patient ID: Julie Hawkins    DOB: 04/21/1980, 38 y.o. female   MRN: [id number]  Julie Hawkins is a 38 y.o. female with a history of depression, h/o STI, tobacco use here for   Vaginal discharge Patient states she noticed an abnormal urine color and thought she had a yeast infection.  She then tried drinking cranberry juice and using Monistat but without any improvement.  Then about a week ago she started noticing a greenish and white vaginal discharge with associated vaginal itching and dysuria.  She denies sexual activity.  Last had sex a year ago, denies oral sex.  Denies recent antibiotic use.  Denies fever, vaginal bleeding, back pain.  Has noticed an itchy rash in her genital area but no ulcers or sores.  Sometimes will have some lower abdominal pain.  She is on Depo-Provera for contraception and therefore does not have periods.  She is due for her Pap smear, last was negative in 2012.  Review of Systems:  Per HPI.  PMFSH, medications and smoking status reviewed.  Objective:   BP 100/60   Pulse (!) 111   Temp 98.2 F (36.8 C) (Oral)   Ht 5\' 3"  (1.6 m)   Wt 178 lb 4 oz (80.9 kg)   SpO2 96%   BMI 31.58 kg/m  Vitals and nursing note reviewed.  General: well nourished, well developed, in no acute distress with non-toxic appearance GYN:  External genitalia within normal limits.  Vaginal mucosa pink, moist, normal rugae.  Nonfriable cervix without lesions. Greenish white discharge noted on speculum exam. Some spotting noted after collection of pap. Bimanual exam revealed normal, nongravid uterus.  No cervical motion tenderness. No adnexal masses bilaterally.   Skin: warm, dry, no rashes or lesions Extremities: warm and well perfused, normal tone MSK: ROM grossly intact, strength intact, gait normal Neuro: Alert and oriented, speech normal  Assessment & Plan:   Vaginal discharge Wet prep consistent with bacterial vaginosis as well as Trichomonas infection.   UA obtained with positive leukocytes however no nitrites, most likely consistent with trichomonal infection.  Instructed to call if no better after treatment.  Also obtained GC/CT and Pap smear, will call with results.  Orders Placed This Encounter  Procedures  . POCT urinalysis dipstick  . POCT UA - Microscopic Only  . POCT Wet Prep [facility])   Meds ordered this encounter  Medications  . metroNIDAZOLE (FLAGYL) 500 MG tablet    Sig: Take 1 tablet (500 mg total) by mouth 2 (two) times daily.    Dispense:  14 tablet    Refill:  0  . fluconazole (DIFLUCAN) 150 MG tablet    Sig: Take 1 tablet (150 mg total) by mouth once for 1 dose.    Dispense:  2 tablet    Refill:  0    Ellwood Dense, DO PGY-2, [facility] 06/11/2018 5:05 PM

## 2018-06-11 ENCOUNTER — Ambulatory Visit: Payer: Medicaid Other | Admitting: Family Medicine

## 2018-06-11 ENCOUNTER — Other Ambulatory Visit (HOSPITAL_COMMUNITY)
Admission: RE | Admit: 2018-06-11 | Discharge: 2018-06-11 | Disposition: A | Discharge status: Home / Self Care | Payer: Medicaid Other | Source: Ambulatory Visit | Attending: Emergency Medicine | Admitting: Emergency Medicine

## 2018-06-11 ENCOUNTER — Other Ambulatory Visit: Payer: Self-pay

## 2018-06-11 ENCOUNTER — Ambulatory Visit (INDEPENDENT_AMBULATORY_CARE_PROVIDER_SITE_OTHER): Payer: Self-pay | Admitting: Family Medicine

## 2018-06-11 VITALS — BP 100/60 | HR 111 | Temp 98.2°F | Ht 63.0 in | Wt 178.2 lb

## 2018-06-11 DIAGNOSIS — Z124 Encounter for screening for malignant neoplasm of cervix: Secondary | ICD-10-CM | POA: Insufficient documentation

## 2018-06-11 DIAGNOSIS — N898 Other specified noninflammatory disorders of vagina: Secondary | ICD-10-CM

## 2018-06-11 DIAGNOSIS — R300 Dysuria: Secondary | ICD-10-CM

## 2018-06-11 DIAGNOSIS — Z3009 Encounter for other general counseling and advice on contraception: Secondary | ICD-10-CM

## 2018-06-11 LAB — POCT WET PREP (WET MOUNT): Clue Cells Wet Prep Whiff POC: POSITIVE

## 2018-06-11 LAB — POCT URINALYSIS DIP (MANUAL ENTRY)
Bilirubin, UA: NEGATIVE
Glucose, UA: NEGATIVE mg/dL
Ketones, POC UA: NEGATIVE mg/dL
Nitrite, UA: NEGATIVE
Protein Ur, POC: NEGATIVE mg/dL
Spec Grav, UA: 1.01 (ref 1.010–1.025)
Urobilinogen, UA: 0.2 E.U./dL
pH, UA: 6 (ref 5.0–8.0)

## 2018-06-11 LAB — POCT UA - MICROSCOPIC ONLY

## 2018-06-11 MED ORDER — FLUCONAZOLE 150 MG PO TABS: 150.0000 mg | ORAL_TABLET | Freq: Once | ORAL | 0 refills | Status: AC

## 2018-06-11 MED ORDER — METRONIDAZOLE 500 MG PO TABS: 500.0000 mg | ORAL_TABLET | Freq: Two times a day (BID) | ORAL | 0 refills | Status: DC

## 2018-06-11 NOTE — Assessment & Plan Note (Signed)
Wet prep consistent with bacterial vaginosis as well as Trichomonas infection.  UA obtained with positive leukocytes however no nitrites, most likely consistent with trichomonal infection.  Instructed to call if no better after treatment.  Also obtained GC/CT and Pap smear, will call with results.

## 2018-06-11 NOTE — Patient Instructions (Signed)
It was great to see you!  Our plans for today:  - You have bacterial vaginosis as well as trichomonas. We are prescribing an antibiotic for this. Take this twice daily until it is gone. It is important to always wear condoms when you have sex. - We collected your pap smear and tested for gonorrhea and chlamydia. These will take a few days to come back. We will call you with these results.  Take care and seek immediate care sooner if you develop any concerns.   Dr. [facility]

## 2018-06-14 ENCOUNTER — Other Ambulatory Visit (HOSPITAL_COMMUNITY)
Admission: RE | Admit: 2018-06-14 | Discharge: 2018-06-14 | Disposition: A | Discharge status: Home / Self Care | Payer: Medicaid Other | Source: Ambulatory Visit | Attending: Emergency Medicine | Admitting: Emergency Medicine

## 2018-06-14 ENCOUNTER — Ambulatory Visit (INDEPENDENT_AMBULATORY_CARE_PROVIDER_SITE_OTHER): Payer: Medicaid Other

## 2018-06-14 ENCOUNTER — Other Ambulatory Visit: Payer: Self-pay

## 2018-06-14 DIAGNOSIS — Z3042 Encounter for surveillance of injectable contraceptive: Secondary | ICD-10-CM | POA: Diagnosis present

## 2018-06-14 DIAGNOSIS — N898 Other specified noninflammatory disorders of vagina: Secondary | ICD-10-CM | POA: Insufficient documentation

## 2018-06-14 MED ORDER — MEDROXYPROGESTERONE ACETATE 150 MG/ML IM SUSP
150.0000 mg | Freq: Once | INTRAMUSCULAR | Status: AC
  Administered 2018-06-14: 150 mg via INTRAMUSCULAR

## 2018-06-14 NOTE — Progress Notes (Signed)
Pt presents in nurse clinic for depo provera injection. Pt is within dates and was recently seen by a provider. Injection given RUOQ, site unremarkable. Next injection, 7/13-7/27, reminder card given.

## 2018-06-14 NOTE — Addendum Note (Signed)
Addended by: Jennette Bill on: 06/14/2018 10:02 AM   Modules accepted: Orders

## 2018-06-15 LAB — CERVICOVAGINAL ANCILLARY ONLY
Chlamydia: NEGATIVE
Neisseria Gonorrhea: NEGATIVE
Trichomonas: POSITIVE — AB

## 2018-06-16 ENCOUNTER — Telehealth: Payer: Self-pay

## 2018-06-16 NOTE — Telephone Encounter (Signed)
Called and LVM for patient to call back and retrieve message from Dr. Linwood Dibbles concerning her antibiotic and the reaction she is having.  Glennie Hawk, CMA

## 2018-06-16 NOTE — Telephone Encounter (Signed)
Called patient with results.    Patient states that the medication that was prescribed for her of Friday is making her have an upset stomach with nausea and diarrhea. No vomiting.  Patient would like to know if there is a shot or something else that she can take instead.  Please call patient with alternative medication.  She states that she has two days left.  Glennie Hawk, CMA

## 2018-06-16 NOTE — Telephone Encounter (Signed)
Make sure to take with food and not to drink alcohol with it cause it can make her sick. If she only has 2 days left, try the beforementioned. There aren't any other more effective options that wouldn't require starting the course over. Let me know if she is still having symptoms after finishing her antibiotics.

## 2018-06-18 LAB — CYTOLOGY - PAP
Diagnosis: NEGATIVE
HPV: NOT DETECTED

## 2018-06-21 ENCOUNTER — Encounter: Payer: Self-pay | Admitting: *Deleted

## 2018-07-22 IMAGING — DX DG LUMBAR SPINE 2-3V
3 series · 3 of 3 positions shown · non-contrast
Comparison: None.

CLINICAL DATA: Low back pain

EXAM:
LUMBAR SPINE - 2-3 VIEW

[t lumbar spine ap]
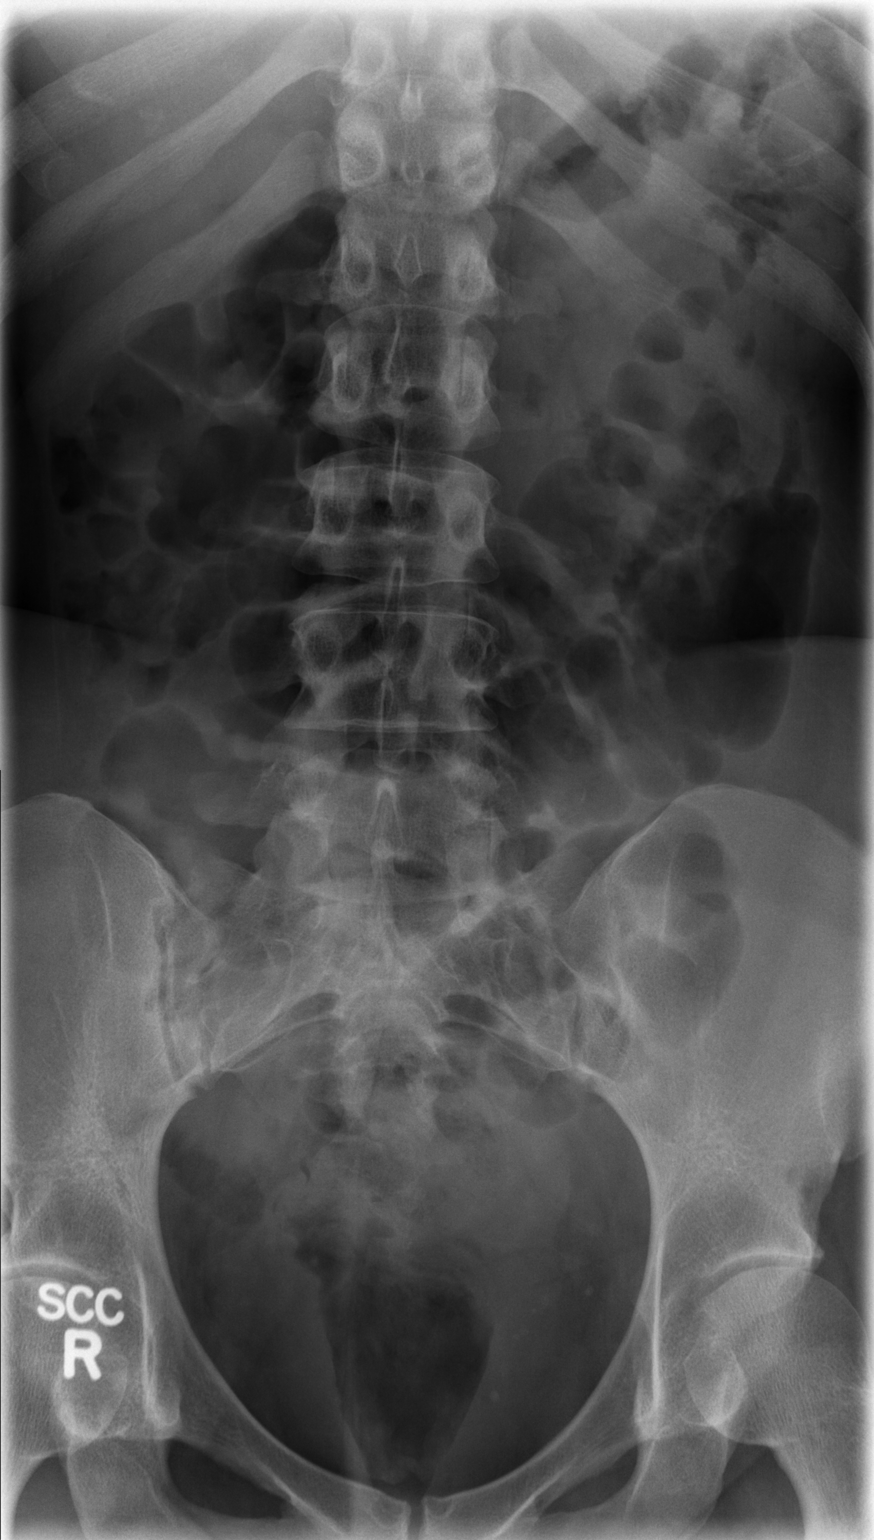

[t lumbar spine lat]
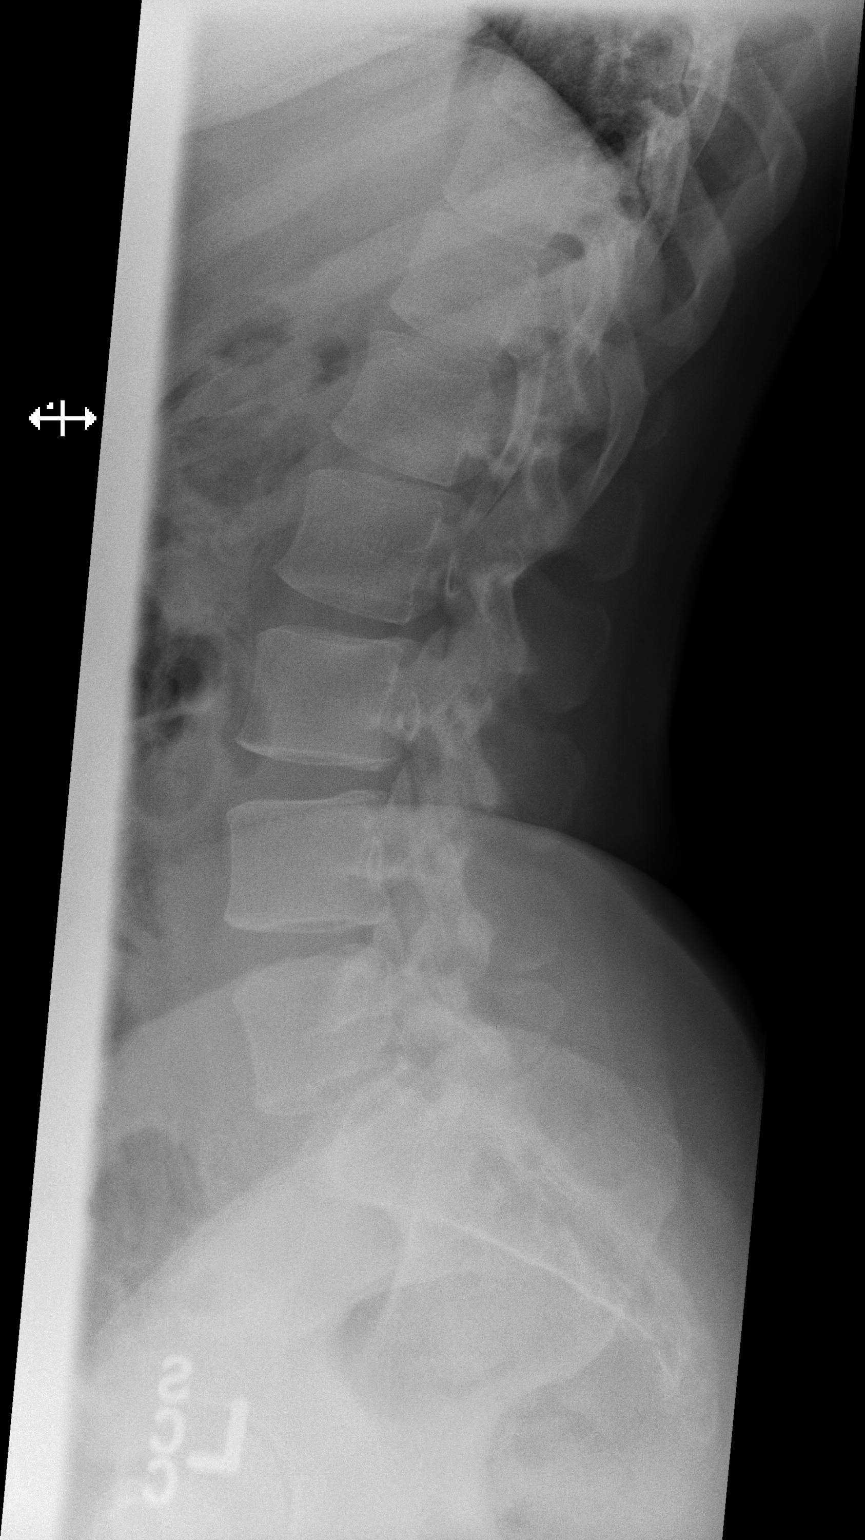

[t lumbar l-5 s-1 spot]
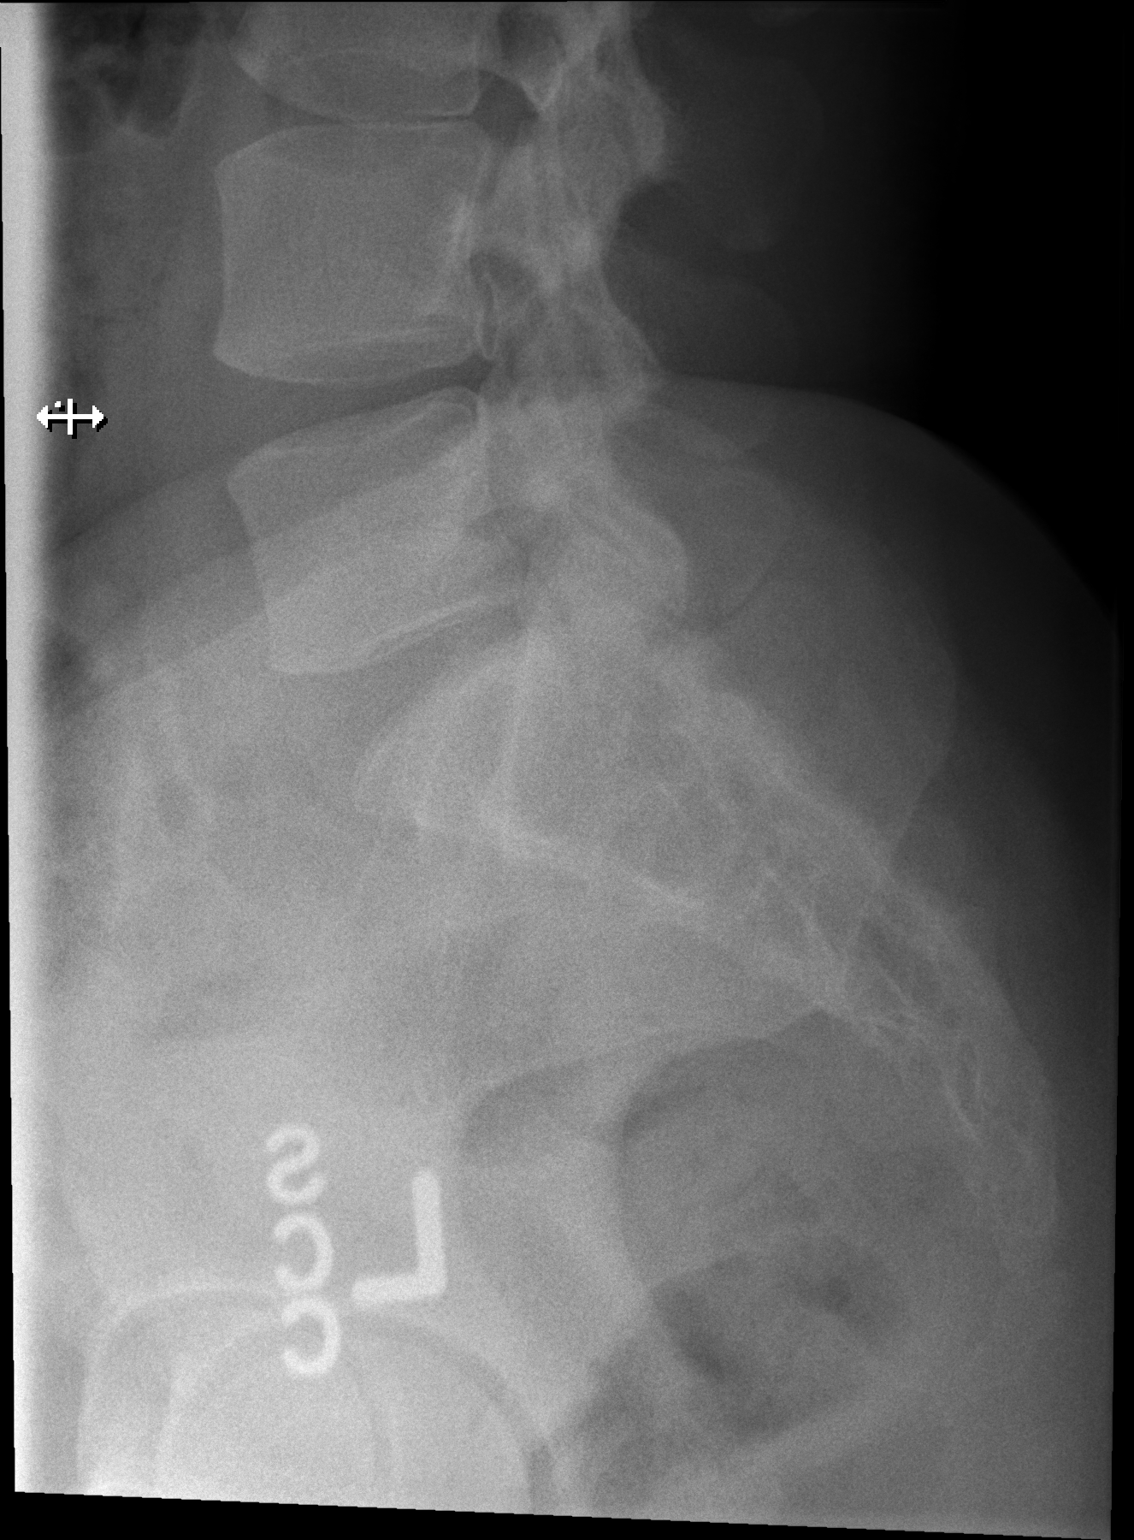

[3 of 3 positions shown; findings below may reference images not displayed]

FINDINGS: Five lumbar-type vertebral bodies food

Normal lumbar lordosis.

No evidence of fracture or dislocation. Vertebral body heights are
maintained.

Visualized bony pelvis appears intact.
IMPRESSION: Negative.

## 2018-09-14 ENCOUNTER — Ambulatory Visit: Payer: Medicaid Other

## 2018-09-23 ENCOUNTER — Other Ambulatory Visit: Payer: Self-pay

## 2018-09-23 ENCOUNTER — Ambulatory Visit (INDEPENDENT_AMBULATORY_CARE_PROVIDER_SITE_OTHER): Payer: Medicaid Other | Admitting: Family Medicine

## 2018-09-23 VITALS — HR 97

## 2018-09-23 DIAGNOSIS — Z202 Contact with and (suspected) exposure to infections with a predominantly sexual mode of transmission: Secondary | ICD-10-CM | POA: Diagnosis not present

## 2018-09-23 DIAGNOSIS — R300 Dysuria: Secondary | ICD-10-CM

## 2018-09-23 DIAGNOSIS — N898 Other specified noninflammatory disorders of vagina: Secondary | ICD-10-CM

## 2018-09-23 DIAGNOSIS — Z3042 Encounter for surveillance of injectable contraceptive: Secondary | ICD-10-CM

## 2018-09-23 DIAGNOSIS — N730 Acute parametritis and pelvic cellulitis: Secondary | ICD-10-CM

## 2018-09-23 LAB — POCT URINE PREGNANCY: Preg Test, Ur: NEGATIVE

## 2018-09-23 LAB — POCT WET PREP (WET MOUNT)
Clue Cells Wet Prep Whiff POC: NEGATIVE
Trichomonas Wet Prep HPF POC: ABSENT

## 2018-09-23 LAB — POCT URINALYSIS DIP (MANUAL ENTRY)
Bilirubin, UA: NEGATIVE
Blood, UA: NEGATIVE
Glucose, UA: NEGATIVE mg/dL
Ketones, POC UA: NEGATIVE mg/dL
Leukocytes, UA: NEGATIVE
Nitrite, UA: NEGATIVE
Protein Ur, POC: NEGATIVE mg/dL
Spec Grav, UA: 1.02 (ref 1.010–1.025)
Urobilinogen, UA: 1 E.U./dL
pH, UA: 6.5 (ref 5.0–8.0)

## 2018-09-23 MED ORDER — CEFTRIAXONE SODIUM 250 MG IJ SOLR
250.0000 mg | Freq: Once | INTRAMUSCULAR | Status: AC
  Administered 2018-09-23: 250 mg via INTRAMUSCULAR

## 2018-09-23 MED ORDER — MEDROXYPROGESTERONE ACETATE 150 MG/ML IM SUSP
150.0000 mg | Freq: Once | INTRAMUSCULAR | Status: AC
  Administered 2018-09-23: 17:00:00 150 mg via INTRAMUSCULAR

## 2018-09-23 MED ORDER — DOXYCYCLINE HYCLATE 100 MG PO TABS: 100.0000 mg | ORAL_TABLET | Freq: Two times a day (BID) | ORAL | 0 refills | Status: AC

## 2018-09-23 MED ORDER — METRONIDAZOLE 250 MG PO TABS: 500.0000 mg | ORAL_TABLET | Freq: Two times a day (BID) | ORAL | 0 refills | Status: AC

## 2018-09-23 MED ORDER — AZITHROMYCIN 500 MG PO TABS
1000.0000 mg | ORAL_TABLET | Freq: Once | ORAL | Status: AC
  Administered 2018-09-23: 1000 mg via ORAL

## 2018-09-23 NOTE — Addendum Note (Signed)
Addended by: Leonia Corona R on: 09/23/2018 05:24 PM   Modules accepted: Orders

## 2018-09-23 NOTE — Patient Instructions (Addendum)
Pelvic Inflammatory Disease  Pelvic inflammatory disease (PID) is an infection in some or all of the female reproductive organs. PID can be in the womb (uterus), ovaries, fallopian tubes, or nearby tissues that are inside the lower belly area (pelvis). PID can lead to problems if it is not treated. What are the causes?  Germs (bacteria) that are spread during sex. This is the most common cause.  Germs in the vagina that are not spread during sex.  Germs that travel up from the vagina or cervix to the reproductive organs after: ? The birth of a baby. ? A miscarriage. ? An abortion. ? Pelvic surgery. ? Insertion of an intrauterine device (IUD). ? A sexual assault. What increases the risk?  Being younger than 38 years old.  Having sex at a young age.  Having a history of STI (sexually transmitted infection) or PID.  Not using barrier birth control, such as condoms.  Having a lot of sex partners.  Having sex with someone who has symptoms of an STI.  Using a douche.  Having an IUD put in place. What are the signs or symptoms?  Pain in the belly area.  Fever.  Chills.  Discharge from the vagina that is not normal.  Bleeding from the womb that is not normal.  Pain soon after the end of a menstrual period.  Pain when you pee (urinate).  Pain with sex.  Feeling sick to your stomach (nauseous) or throwing up (vomiting). How is this treated?  Antibiotic medicines. In very bad cases, these may be given through an IV tube.  Surgery. This is rare.  Efforts to stop the spread of the infection. Sex partners may need to be treated. It may take weeks until you feel all better. Your doctor may test you for infection again after you finish treatment. You should also be checked for HIV (human immunodeficiency virus). Follow these instructions at home:  Take over-the-counter and prescription medicines only as told by your doctor.  If you were prescribed an antibiotic  medicine, take it as told by your doctor. Do not stop taking it even if you start to feel better.  Do not have sex until treatment is done or as told by your doctor.  Tell your sex partner if you have PID. Your partner may need to be treated.  Keep all follow-up visits as told by your doctor. This is important. Contact a doctor if:  You have more fluid or fluid that is not normal coming from your vagina.  Your pain does not improve.  You throw up.  You have a fever.  You cannot take your medicines.  Your partner has an STI.  You have pain when you pee. Get help right away if:  You have more pain in the belly area.  You have chills.  You are not better in 72 hours with treatment. Summary  Pelvic inflammatory disease (PID) is caused by an infection in some or all of the female reproductive organs.  PID is a serious infection.  This infection is most often treated with antibiotics.  Do not have sex until treatment is done or as told by your doctor. This information is not intended to replace advice given to you by your health care provider. Make sure you discuss any questions you have with your health care provider. Document Released: 05/02/2008 Document Revised: 10/22/2017 Document Reviewed: 10/28/2017 Elsevier Patient Education  2020 [employer].   For your infection:  You will receive a dose  of a medicine here. It is a shot. Please also take metronidazole 500mg  twice a day for 14 days + doxycyline twice a day for 14 days.   Make sure all partners are treated for STDS as well.   If you develop a fever or worsening pain go to the emergency room.   Dr. Darin EngelsAbraham

## 2018-09-23 NOTE — Progress Notes (Signed)
Patient presents for Depo shot.  Patient was late so a urine pregnancy was done.  Results were negative.  Shot given in the [place].    Patient tolerated well.  Patient given reminder card for next shot Oct 22-Nov 5.  .Ozella Almond, CMA

## 2018-09-23 NOTE — Progress Notes (Signed)
   Subjective:    Patient ID: Julie Hawkins, female    DOB: 03-06-1980, 38 y.o.   MRN: [id number]   CC: vaginal discharge  HPI: VAGINAL DISCHARGE  Onset: April    Description: thick, lumpy  Odor: fishy   Itching: yes  Symptoms Dysuria: yes  Bleeding: yes  Pelvic pain: yes  Back pain: no  Fever: no  Genital sores: no  Rash: no  Dyspareunia: yes  GI Sxs: no  Prior treatment: yes, but could not complete 2/2 vomiting   Red Flags: Missed period: no  Pregnancy: no  Recent antibiotics: yes, but couldn't complete course   Sexual activity: no  Possible STD exposure: no  IUD: no  Diabetes: no    Smoking status reviewed  Review of Systems   Objective:  Pulse 97   SpO2 100%  Vitals and nursing note reviewed  General: well nourished, in no acute distress HEENT: normocephalic Cardiac: RRR, clear S1 and S2, no murmurs, rubs, or gallops Respiratory: clear to auscultation bilaterally, no increased work of breathing Abdomen: soft, nontender, nondistended, no masses or organomegaly. Bowel sounds present Extremities: no edema or cyanosis. Warm, well perfused.  Skin: warm and dry, no rashes noted Neuro: alert and oriented, no focal deficits Female genitalia: normal external genitalia, vulva, vagina, cervix, uterus and adnexa, thin grey discharge noted, CMT on exam  Assessment & Plan:    PID (acute pelvic inflammatory disease) Physical exam consistent with pelvic inflammatory disease.  Can consider worsening trichomonas infection as cause.  Patient never took the treatment of metronidazole so this would explain this.  Advised that patient take metronidazole as well as doxycycline and stressed the importance of this to prevent further infection.  Patient states that she is now aware that this can cause severe disease so will take all of her pills for 14-day course.  Will give a dose of ceftriaxone and azithromycin here in clinic.  We will also obtain gonorrhea and Chlamydia  testing as well as HIV, RPR, hepatitis C testing.  Have double checked with Dr. Valentina Lucks who confirmed that 250 mg pill of metronidazole is smaller so it may be easier for patient to swallow or she can crush the pills if needed.  Will prescribe metronidazole 500 mg twice daily x14 days, doxycycline twice daily x14 days, as well as the azithromycin and ceftriaxone she obtained here in clinic.  Strict return precautions given.  Follow-up if no improvement.  Patient was informed that she needed to have blood work drawn however she left without obtaining these labs.  We will have red team CMA call patient to inform her of the importance of these labs.  We will place labs as future order so she can come in and have these drawn.    Return if symptoms worsen or fail to improve.   Caroline More, DO, PGY-3

## 2018-09-23 NOTE — Assessment & Plan Note (Addendum)
Physical exam consistent with pelvic inflammatory disease.  Can consider worsening trichomonas infection as cause.  Patient never took the treatment of metronidazole so this would explain this.  Advised that patient take metronidazole as well as doxycycline and stressed the importance of this to prevent further infection.  Patient states that she is now aware that this can cause severe disease so will take all of her pills for 14-day course.  Will give a dose of ceftriaxone and azithromycin here in clinic.  We will also obtain gonorrhea and Chlamydia testing as well as HIV, RPR, hepatitis C testing.  Have double checked with Dr. Valentina Lucks who confirmed that 250 mg pill of metronidazole is smaller so it may be easier for patient to swallow or she can crush the pills if needed.  Will prescribe metronidazole 500 mg twice daily x14 days, doxycycline twice daily x14 days, as well as the azithromycin and ceftriaxone she obtained here in clinic.  Strict return precautions given.  Follow-up if no improvement.  Patient was informed that she needed to have blood work drawn however she left without obtaining these labs.  We will have red team CMA call patient to inform her of the importance of these labs.  We will place labs as future order so she can come in and have these drawn.

## 2018-09-24 ENCOUNTER — Other Ambulatory Visit: Payer: Self-pay

## 2018-09-24 ENCOUNTER — Other Ambulatory Visit: Payer: Medicaid Other

## 2018-09-24 ENCOUNTER — Telehealth: Payer: Self-pay

## 2018-09-24 NOTE — Telephone Encounter (Signed)
Patient did not complete lab work on visit 09/23/2018.  Called patient and she states that she will visit lab on 09/24/2018.  Patient is currently at work in a meeting and will come in afterwards.  Ozella Almond, [place]

## 2018-09-24 NOTE — Addendum Note (Signed)
Addended by: Calvert Cantor on: 09/24/2018 09:53 AM   Modules accepted: Orders

## 2018-09-24 NOTE — Telephone Encounter (Signed)
-----   Message from Caroline More, DO sent at 09/23/2018  5:31 PM EDT -----

## 2018-09-25 LAB — HIV ANTIBODY (ROUTINE TESTING W REFLEX): HIV Screen 4th Generation wRfx: NONREACTIVE

## 2018-09-25 LAB — RPR: RPR Ser Ql: NONREACTIVE

## 2018-09-25 LAB — HEPATITIS C ANTIBODY: Hep C Virus Ab: <0.1 s/co ratio (ref 0.0–0.9)

## 2018-09-27 ENCOUNTER — Telehealth: Payer: Self-pay

## 2018-09-27 NOTE — Telephone Encounter (Signed)
Called patient and LVM to call clinic for neg results.  Julie Hawkins, [place]

## 2018-12-13 ENCOUNTER — Ambulatory Visit (INDEPENDENT_AMBULATORY_CARE_PROVIDER_SITE_OTHER): Payer: Medicaid Other

## 2018-12-13 ENCOUNTER — Other Ambulatory Visit: Payer: Self-pay

## 2018-12-13 DIAGNOSIS — Z30019 Encounter for initial prescription of contraceptives, unspecified: Secondary | ICD-10-CM | POA: Diagnosis not present

## 2018-12-13 MED ORDER — MEDROXYPROGESTERONE ACETATE 150 MG/ML IM SUSP
150.0000 mg | Freq: Once | INTRAMUSCULAR | Status: AC
  Administered 2018-12-13: 13:00:00 150 mg via INTRAMUSCULAR

## 2018-12-13 NOTE — Progress Notes (Signed)
Patient presents in nurse clinic for depo provera injection. Patient is within dates. Injection given LUOQ, site unremarkable. Next injection due, 02/28/2019-03/14/2019. Reminder card given.

## 2019-03-11 ENCOUNTER — Ambulatory Visit: Payer: Medicaid Other

## 2019-03-17 ENCOUNTER — Other Ambulatory Visit: Payer: Self-pay

## 2019-03-17 ENCOUNTER — Ambulatory Visit (INDEPENDENT_AMBULATORY_CARE_PROVIDER_SITE_OTHER): Payer: Medicaid Other | Admitting: *Deleted

## 2019-03-17 DIAGNOSIS — Z3042 Encounter for surveillance of injectable contraceptive: Secondary | ICD-10-CM

## 2019-03-17 LAB — POCT URINE PREGNANCY: Preg Test, Ur: NEGATIVE

## 2019-03-17 MED ORDER — MEDROXYPROGESTERONE ACETATE 150 MG/ML IM SUSY
150.0000 mg | PREFILLED_SYRINGE | Freq: Once | INTRAMUSCULAR | Status: AC
  Administered 2019-03-17: 150 mg via INTRAMUSCULAR

## 2019-03-17 NOTE — Progress Notes (Signed)
Pt is here today for a depo.  She was due by 03/14/19.    Last contraceptive appt was: 05/2018.  Advised that next appt must be with a provider.  It has been 3 months since last unprotected intercourse.  Her upreg today is negative.   Patient instructed to use protection x 1 week but states that she will not need to do that.    Pt given injection today in her RUOQ, tolerated well.  She is due for next depo 06/02/19- 06/16/19. Reminder card given.   Jone Baseman, CMA

## 2019-05-26 ENCOUNTER — Encounter: Payer: Self-pay | Admitting: Family Medicine

## 2019-05-26 ENCOUNTER — Ambulatory Visit (INDEPENDENT_AMBULATORY_CARE_PROVIDER_SITE_OTHER): Payer: Medicaid Other | Admitting: Family Medicine

## 2019-05-26 ENCOUNTER — Other Ambulatory Visit: Payer: Self-pay

## 2019-05-26 ENCOUNTER — Ambulatory Visit: Payer: Medicaid Other | Admitting: Family Medicine

## 2019-05-26 ENCOUNTER — Other Ambulatory Visit (HOSPITAL_COMMUNITY)
Admission: RE | Admit: 2019-05-26 | Discharge: 2019-05-26 | Disposition: A | Discharge status: Home / Self Care | Payer: Medicaid Other | Source: Ambulatory Visit | Attending: Family Medicine | Admitting: Family Medicine

## 2019-05-26 VITALS — BP 100/70 | HR 85 | Ht 63.0 in | Wt 197.4 lb

## 2019-05-26 DIAGNOSIS — N898 Other specified noninflammatory disorders of vagina: Secondary | ICD-10-CM | POA: Insufficient documentation

## 2019-05-26 DIAGNOSIS — N730 Acute parametritis and pelvic cellulitis: Secondary | ICD-10-CM

## 2019-05-26 DIAGNOSIS — Z131 Encounter for screening for diabetes mellitus: Secondary | ICD-10-CM

## 2019-05-26 DIAGNOSIS — A599 Trichomoniasis, unspecified: Secondary | ICD-10-CM

## 2019-05-26 LAB — POCT GLYCOSYLATED HEMOGLOBIN (HGB A1C): Hemoglobin A1C: 5.3 % (ref 4.0–5.6)

## 2019-05-26 LAB — POCT WET PREP (WET MOUNT)
Clue Cells Wet Prep Whiff POC: NEGATIVE
Trichomonas Wet Prep HPF POC: ABSENT
WBC, Wet Prep HPF POC: >20

## 2019-05-26 NOTE — Assessment & Plan Note (Deleted)
Her discharge is concerning for Trichomonas infection. Wet prep negative, sent off to lab for NAAT.

## 2019-05-26 NOTE — Assessment & Plan Note (Signed)
Her discharge is concerning for Trichomonas infection. Wet prep negative, sent off to lab for NAAT.  

## 2019-05-26 NOTE — Progress Notes (Deleted)
    SUBJECTIVE:   CHIEF COMPLAINT / HPI:   Pap smear/Vaginal Issues: ***  PERTINENT  PMH / PSH: ***  OBJECTIVE:   There were no vitals taken for this visit.   Physical Exam: General: *** Resp: *** Cardiac: *** Abd: *** GU: ***  ASSESSMENT/PLAN:   No problem-specific Assessment & Plan notes found for this encounter.     Dollene Cleveland, DO [facility]

## 2019-05-26 NOTE — Progress Notes (Addendum)
    SUBJECTIVE:   CHIEF COMPLAINT / HPI:   Vaginal discharge  Presents today with a 2 week history of cloudy discharge that turned green yesterday, and a 1 month history of bilateral pelvic pain. She states that the discharge has a strong odor and is similar to the discharge she presented with last year when she had Trichomonas. She is sexually active and uses condoms regularly as well as being on Depo-provera for the last 14 years. She states that she is not happy with the Depo as she has not had a period in 4 years and would like to change her birth control. Additionally she endorses an increased frequency of yeast infections on the Depo, stating that she gets them "at least 1 week out of every month." When she gets them she increases her water intake, drinks cranberry juice, and takes monostat. The infection clears in about 3-4 days. Urine has potent odor at that time.  Her pelvic pain is bilateral but worse on the left side. She endorses increased pain with tampons and intercourse. She describes the pain as pressure in her ovaries.  Decreased urination Patients is worried about decreased urination over the last month. She urinated 2 times a day despite drinking a whole jug of water. Prior to this month she was urinating 4-5 times a day.  Constipation  She endorses a change in bowel movement for last 3 weeks. She describes her stool as hard balls and soft at the same time. She has left sided abdominal pain and feels like her "stomach gets hard after she eats." She has not tried anything for it. Having a bowel movement cuases a decrease in the pain.  Hyperpigmentation of skin She has noticed hyperpigmentation in the creases of her skin, especially her neck, for the last 2 years. Additionally she endorses increased daytime sleepiness. He daughter is prediabetic and has the same hyperpigmentation so she is concerned about diabetes.   PERTINENT  PMH / PSH: Pelvic inflammatory disease, Trichomonas  vaginalis infection, vaginal discharge  OBJECTIVE:   BP 100/70   Pulse 85   Ht 5\' 3"  (1.6 m)   Wt 197 lb 6 oz (89.5 kg)   SpO2 99%   BMI 34.96 kg/m   Physical Exam Constitutional:      Appearance: Normal appearance.  Genitourinary:     Genitourinary Comments: Bimanual exam demonstrated some cervical and ovarian tenderness.   Abdominal:     Tenderness: There is abdominal tenderness in the left lower quadrant.  Neurological:     Mental Status: She is alert.     ASSESSMENT/PLAN:   Vaginal discharge Her discharge is concerning for Trichomonas infection. Wet prep negative, sent off to lab for NAAT.   PID (acute pelvic inflammatory disease) Though her exam showed showed some cervical tenderness, the pain was not characteristic of PID pain. Set off gonorrhea and chlamydia NAAT test. Will wait to treat until results return  Acanthosis nigricans Her hyperpigmentation is consistent with acanthosis nigricans. A1c is 5.3 today.  Birth control methods Provided her with a comprehensive list of birth control methods. Asked her follow up with her PCP before her next depo shot if she chooses to switch birth control methods.  , Medical Student [facility]

## 2019-05-26 NOTE — Patient Instructions (Addendum)
It was great seeing you today, thank you for coming in.  We will send off sample to the lab. Should expect results in the next couple of days.  We will check your A1c today and call you with the results.  For constipation, mix a capful of Miralax in 8 ounces of water everyday.  We provided you with a list of options for birth control if you choose to switch methods. Please follow up with your PCP before your next Depo shot.

## 2019-05-26 NOTE — Assessment & Plan Note (Addendum)
Though her exam showed some cervical tenderness, the pain was not characteristic of PID. Sent off gonorrhea and chlamydia NAAT test. Will wait to treat until results return

## 2019-05-26 NOTE — Assessment & Plan Note (Deleted)
Though her exam showed showed some cervical tenderness, the pain was not characteristic of PID pain. Set off gonorrhea and chlamydia NAAT test. Will wait to treat until results return

## 2019-05-27 LAB — CERVICOVAGINAL ANCILLARY ONLY
Chlamydia: NEGATIVE
Comment: NEGATIVE
Comment: NEGATIVE
Comment: NORMAL
Neisseria Gonorrhea: NEGATIVE
Trichomonas: POSITIVE — AB

## 2019-05-30 ENCOUNTER — Encounter: Payer: Self-pay | Admitting: Family Medicine

## 2019-05-30 ENCOUNTER — Telehealth: Payer: Self-pay | Admitting: Family Medicine

## 2019-05-30 DIAGNOSIS — A5901 Trichomonal vulvovaginitis: Secondary | ICD-10-CM

## 2019-05-30 MED ORDER — METRONIDAZOLE 500 MG PO TABS: 500.0000 mg | ORAL_TABLET | Freq: Two times a day (BID) | ORAL | 0 refills | Status: AC

## 2019-05-30 NOTE — Telephone Encounter (Signed)
I discussed the positive NAAT for trichomonas with Ms Julie Hawkins by phone.  She said she would contact her partner and that her partner would be able to obtain treatment from their physician.    Rx sent Flagyl 50 mg BID x 7 days to pharmacy.   We discussed refraining from sexual intercourse for seven days after completion of treatment by her and her partner, whichever is longer.  We discussed role of condoms in preventing transmission of STDs including trichomonas.

## 2019-06-13 ENCOUNTER — Other Ambulatory Visit: Payer: Self-pay

## 2019-06-13 ENCOUNTER — Ambulatory Visit: Payer: Medicaid Other

## 2019-06-13 DIAGNOSIS — Z3042 Encounter for surveillance of injectable contraceptive: Secondary | ICD-10-CM

## 2019-06-13 MED ORDER — MEDROXYPROGESTERONE ACETATE 150 MG/ML IM SUSP
150.0000 mg | Freq: Once | INTRAMUSCULAR | Status: AC
  Administered 2019-09-14: 150 mg via INTRAMUSCULAR

## 2019-06-13 NOTE — Progress Notes (Signed)
Patient presents in nurse clinic for depo provera injection. Patient is within her dates. Injection given LUOQ, site unremarkable. Next injection due, July 12-July 26. Reminder card given.   Patient advised to followup with PCP before she graduates and if she wants to switch birth controls methods, per McDiarmid note on 05/26/2019.

## 2019-09-14 ENCOUNTER — Other Ambulatory Visit: Payer: Self-pay

## 2019-09-14 ENCOUNTER — Ambulatory Visit (INDEPENDENT_AMBULATORY_CARE_PROVIDER_SITE_OTHER): Payer: Self-pay

## 2019-09-14 DIAGNOSIS — Z3042 Encounter for surveillance of injectable contraceptive: Secondary | ICD-10-CM

## 2019-09-14 LAB — POCT URINE PREGNANCY: Preg Test, Ur: NEGATIVE

## 2019-09-14 NOTE — Progress Notes (Signed)
Patient here today for Depo Provera injection and is not within her dates (due by 7/26). Urine pregnancy obtained and was negative. Patient denies being sexually active. Advised patient to use alternate birth control for next 7-10 days if sexually active.   Last contraceptive appt was 05/2019  Depo given in RUOQ today.  Site unremarkable & patient tolerated injection.    Next injection due 10/13-10/27.  Reminder card given.    Veronda Prude, RN

## 2020-01-18 ENCOUNTER — Ambulatory Visit: Payer: Medicaid Other

## 2020-02-14 ENCOUNTER — Ambulatory Visit: Payer: Medicaid Other

## 2020-03-15 ENCOUNTER — Encounter: Payer: Self-pay | Admitting: Family Medicine

## 2020-03-15 ENCOUNTER — Ambulatory Visit (INDEPENDENT_AMBULATORY_CARE_PROVIDER_SITE_OTHER): Payer: Medicaid Other | Admitting: Family Medicine

## 2020-03-15 ENCOUNTER — Ambulatory Visit: Payer: Medicaid Other | Admitting: Family Medicine

## 2020-03-15 ENCOUNTER — Other Ambulatory Visit: Payer: Self-pay

## 2020-03-15 VITALS — BP 122/74 | Temp 98.1°F | Wt 198.0 lb

## 2020-03-15 DIAGNOSIS — Z3202 Encounter for pregnancy test, result negative: Secondary | ICD-10-CM

## 2020-03-15 DIAGNOSIS — Z308 Encounter for other contraceptive management: Secondary | ICD-10-CM | POA: Diagnosis present

## 2020-03-15 DIAGNOSIS — Z3042 Encounter for surveillance of injectable contraceptive: Secondary | ICD-10-CM

## 2020-03-15 LAB — POCT URINE PREGNANCY: Preg Test, Ur: NEGATIVE

## 2020-03-15 MED ORDER — MEDROXYPROGESTERONE ACETATE 150 MG/ML IM SUSP
150.0000 mg | Freq: Once | INTRAMUSCULAR | Status: AC
  Administered 2020-03-15: 150 mg via INTRAMUSCULAR

## 2020-03-15 NOTE — Progress Notes (Signed)
   Subjective:   Patient ID: Julie Hawkins    DOB: 06/27/1980, 40 y.o. female   MRN: [id number]  Julie Hawkins is a 40 y.o. female with a history of depression, menorrhagia, tobacco abuse, h/o trich infection here for contraception management  Contraception Management: Patient previously on Depo. Stop in July 2021 as she wanted to see how her body would do naturally. She has been spotting since November which happens when she stops Depo per patient. Sexually active with one female partner. She is not concerned about STDs and declined STD testing.   Review of Systems:  Per HPI.   Objective:   BP 122/74   Temp 98.1 F (36.7 C) (Oral)   Wt 198 lb (89.8 kg)   BMI 35.07 kg/m  Vitals and nursing note reviewed.  General: pleasant middle aged woman, sitting comfortably in exam chair, well nourished, well developed, in no acute distress with non-toxic appearance Resp: breathing comfortably on room air, speaking in full sentences MSK:  gait normal Neuro: Alert and oriented, speech normal  Assessment & Plan:   Encounter for other contraceptive management Urine pregnancy test negative. Desires to restart Depo. Depo shot given today. Follow up as scheduled. Encourage condom use to avoid STDs  Orders Placed This Encounter  Procedures  . POCT urine pregnancy   Meds ordered this encounter  Medications  . medroxyPROGESTERone (DEPO-PROVERA) injection 150 mg    Orpah Cobb, DO PGY-3, [facility] 03/15/2020 1:52 PM

## 2020-03-15 NOTE — Assessment & Plan Note (Signed)
Urine pregnancy test negative. Desires to restart Depo. Depo shot given today. Follow up as scheduled. Encourage condom use to avoid STDs

## 2020-05-31 ENCOUNTER — Ambulatory Visit (INDEPENDENT_AMBULATORY_CARE_PROVIDER_SITE_OTHER): Payer: Managed Care, Other (non HMO)

## 2020-05-31 ENCOUNTER — Other Ambulatory Visit: Payer: Self-pay

## 2020-05-31 DIAGNOSIS — Z3042 Encounter for surveillance of injectable contraceptive: Secondary | ICD-10-CM | POA: Diagnosis not present

## 2020-05-31 MED ORDER — MEDROXYPROGESTERONE ACETATE 150 MG/ML IM SUSP
150.0000 mg | Freq: Once | INTRAMUSCULAR | Status: AC
  Administered 2020-05-31: 150 mg via INTRAMUSCULAR

## 2020-05-31 NOTE — Progress Notes (Signed)
Patient here today for Depo Provera injection and is within her dates.    Last contraceptive appt was 03/15/2020  Depo given in LUOQ today.  Site unremarkable & patient tolerated injection.    Next injection due 6/30-7/14.  Reminder card given.    Veronda Prude, RN

## 2020-06-07 ENCOUNTER — Encounter: Payer: Self-pay | Admitting: Family Medicine

## 2020-06-07 ENCOUNTER — Ambulatory Visit (INDEPENDENT_AMBULATORY_CARE_PROVIDER_SITE_OTHER): Payer: Managed Care, Other (non HMO) | Admitting: Family Medicine

## 2020-06-07 ENCOUNTER — Other Ambulatory Visit: Payer: Self-pay

## 2020-06-07 VITALS — BP 105/69 | Ht 63.0 in | Wt 193.0 lb

## 2020-06-07 DIAGNOSIS — F32A Depression, unspecified: Secondary | ICD-10-CM | POA: Diagnosis not present

## 2020-06-07 DIAGNOSIS — E669 Obesity, unspecified: Secondary | ICD-10-CM | POA: Diagnosis not present

## 2020-06-07 DIAGNOSIS — Z0000 Encounter for general adult medical examination without abnormal findings: Secondary | ICD-10-CM

## 2020-06-07 DIAGNOSIS — F419 Anxiety disorder, unspecified: Secondary | ICD-10-CM

## 2020-06-07 NOTE — Patient Instructions (Signed)
It was great seeing you today!  Today you came in for your physical. We are doing blood work to check your blood sugars, cholesterol, and kidney function.   We also discussed depression and anxiety. I placed a referral for psychiatry to discuss potential medications. I have also provided you with a list of therapists which I highly recommend in conjunction with medication.   Please check-out at the front desk before leaving the clinic. Please schedule an appointment with your PCP in the next couple of weeks to do a pelvic exam to ensure resolution of STI.    Regarding lab work today:  Due to recent changes in healthcare laws, you may see the results of your imaging and laboratory studies on MyChart before your provider has had a chance to review them.  I understand that in some cases there may be results that are confusing or concerning to you. Not all laboratory results come back in the same time frame and you may be waiting for multiple results in order to interpret others.  Please give [place] 72 hours in order for your provider to thoroughly review all the results before contacting the office for clarification of your results. If everything is normal, you will get a letter in the mail or a message in My Chart. Please give [place] a call if you do not hear from [place] after 2 weeks.  Please bring all of your medications with you to each visit.    If you haven't already, sign up for My Chart to have easy access to your labs results, and communication with your primary care physician.  Feel free to call with any questions or concerns at any time, at [phone].   Take care,  Dr. Cora Collum [facility]

## 2020-06-07 NOTE — Progress Notes (Signed)
    SUBJECTIVE:   CHIEF COMPLAINT / HPI:   Ms. Erker is a 40 year-old who presents for her annual physical exam. She also wants to discuss her anxiety since she now has insurance and states she is interested in medication. Has a history of depression. States she has been feeling more anxious than depressed. States she has been feeling more anxious recently and her children have been bringing it to her attention as well that she needs to see someone for it. States she is more on edge and irritable. Not getting much sleep, about 4-5 hours a night. Mind races, she is constantly worried about things, and she reports only eating once a day. Denies hx of bipolar disorder though she states her children have bipolar, anxiety, ADHD. Denies suicidal or homicidal ideations.   Tobacco use: Stopped smoking on 05/05/20. Prior to that used chantix for 1 week   Gyn: Up to date on pap smear.Sexually active with 1 partner. Recently tested positive for Trichomonas on 4/8 for which she completed her medication. Denies any current vaginal symptoms. Received her depot shot on 4/14    PERTINENT  PMH / PSH:  depression, menorrhagia, tobacco abuse, h/o trich infection   OBJECTIVE:   BP 105/69   Ht 5\' 3"  (1.6 m)   Wt 193 lb (87.5 kg)   BMI 34.19 kg/m    General: alert, pleasant, NAD CV: RRR no murmurs Resp: CTAB Normal WOB  GI: Soft, non-distended  Derm: hyperpigmented thickened area of skin on posterior neck. No other lesions visible.  Psych: mood mildly anxious, affect appropriate. Speech non tangential or racing. No delusions or hallucinations. Alert and oriented   ASSESSMENT/PLAN:   No problem-specific Assessment & Plan notes found for this encounter.  Anxiety  GAD-7 with a score of 15 indicting severe anxiety. Denies SI/HI. Denies bipolar disorder, though from chart review she does have a history of bipolar disorder and was previously on Depakote and Zoloft. Placed referral to psych to discuss  medication. Also gave patient handout with therapists and recommended both therapy and medication for best results - f/u psych consult   H/o Trichomonas infection Positive Trichomonas on 4/8 for which she received treatment. Did not have a test of cure. Advised patient to follow up with PCP for pelvic exam.  - f/u appt for TOC   Obesity  BMI 34. Patient with acanthosis nigricans. Discussed healthy eating and physical activity. Feels anxiety is affecting her eating. Will check lipid panel and A1c  Health maintenance Depot given 4/14. UTD on pap smear. Declines COVID vaccine    5/14, DO [facility]

## 2020-06-08 LAB — LIPID PANEL
Chol/HDL Ratio: 3 ratio (ref 0.0–4.4)
Cholesterol, Total: 164 mg/dL (ref 100–199)
HDL: 54 mg/dL (ref >39)
LDL Chol Calc (NIH): 99 mg/dL (ref 0–99)
Triglycerides: 55 mg/dL (ref 0–149)
VLDL Cholesterol Cal: 11 mg/dL (ref 5–40)

## 2020-06-08 LAB — RENAL FUNCTION PANEL
Albumin: 4.4 g/dL (ref 3.8–4.8)
BUN/Creatinine Ratio: 11 (ref 9–23)
BUN: 10 mg/dL (ref 6–24)
CO2: 17 mmol/L — ABNORMAL LOW (ref 20–29)
Calcium: 9.3 mg/dL (ref 8.7–10.2)
Chloride: 106 mmol/L (ref 96–106)
Creatinine, Ser: 0.93 mg/dL (ref 0.57–1.00)
Glucose: 71 mg/dL (ref 65–99)
Phosphorus: 3.1 mg/dL (ref 3.0–4.3)
Sodium: 141 mmol/L (ref 134–144)
eGFR: 80 mL/min/{1.73_m2} (ref >59)

## 2020-06-08 LAB — HEMOGLOBIN A1C
Est. average glucose Bld gHb Est-mCnc: 114 mg/dL
Hgb A1c MFr Bld: 5.6 % (ref 4.8–5.6)

## 2020-06-09 NOTE — Addendum Note (Signed)
Addended by: Manson Passey, Steven Veazie on: 06/09/2020 02:08 PM   Modules accepted: Level of Service

## 2020-06-28 ENCOUNTER — Other Ambulatory Visit: Payer: Self-pay

## 2020-06-28 ENCOUNTER — Emergency Department (HOSPITAL_BASED_OUTPATIENT_CLINIC_OR_DEPARTMENT_OTHER)
Admission: EM | Admit: 2020-06-28 | Discharge: 2020-06-29 | Disposition: A | Discharge status: Home / Self Care | Payer: Managed Care, Other (non HMO) | Attending: Emergency Medicine | Admitting: Emergency Medicine

## 2020-06-28 ENCOUNTER — Encounter (HOSPITAL_BASED_OUTPATIENT_CLINIC_OR_DEPARTMENT_OTHER): Payer: Self-pay

## 2020-06-28 DIAGNOSIS — Z87891 Personal history of nicotine dependence: Secondary | ICD-10-CM | POA: Insufficient documentation

## 2020-06-28 DIAGNOSIS — H1031 Unspecified acute conjunctivitis, right eye: Secondary | ICD-10-CM

## 2020-06-28 DIAGNOSIS — H53141 Visual discomfort, right eye: Secondary | ICD-10-CM | POA: Diagnosis present

## 2020-06-28 MED ORDER — CIPROFLOXACIN HCL 0.3 % OP SOLN
2.0000 [drp] | OPHTHALMIC | Status: DC
  Administered 2020-06-29: 2 [drp] via OPHTHALMIC
  Filled 2020-06-28: qty 2.5

## 2020-06-28 NOTE — ED Provider Notes (Signed)
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  She is here with her right eye redness and watery exudate that has worsened throughout the day.  She is now having eye discomfort which she rates as an 8 out of 10.  It is not worse with exposure to light.  The discomfort is more on the periphery of the eye and the eyelids and not the cornea.  She denies any injury to the eye.  She states the associated blurred vision is limiting her ability to drive.   Past Medical History:  Diagnosis Date  . Depression     Past Surgical History:  Procedure Laterality Date  . ENDOMETRIAL ABLATION      Family History  Problem Relation Age of Onset  . Cancer Mother 44       breast cancer  . Diabetes Maternal Grandmother   . Diabetes Paternal Grandmother     Social History   Tobacco Use  . Smoking status: Former Smoker    Packs/day: 1.00    Types: Cigarettes    Quit date: 05/05/2020    Years since quitting: 0.1  . Smokeless tobacco: Never Used  . Tobacco comment: smoke black n mild a pack every 2 days  Substance Use Topics  . Alcohol use: No  . Drug use: No    Prior to Admission medications   Medication Sig Start Date End Date Taking? Authorizing Provider  acetaminophen (TYLENOL) 500 MG tablet Take 1,000 mg by mouth every 6 (six) hours as needed for headache (pain).    [provider]  ibuprofen (ADVIL,MOTRIN) 400 MG tablet Take 1 tablet (400 mg total) by mouth every 6 (six) hours as needed. 11/10/15   Mackuen, Courteney Lyn, MD  Multiple Vitamin (MULTIVITAMIN WITH MINERALS) TABS tablet Take 1 tablet by mouth daily.    [provider]    Allergies Patient has no known allergies.   REVIEW OF SYSTEMS  Negative except as noted  here or in the History of Present Illness.   PHYSICAL EXAMINATION  Initial Vital Signs Blood pressure (!) 137/110, pulse 79, temperature 98.5 F (36.9 C), temperature source Oral, resp. rate 16, height 5\' 3"  (1.6 m), weight 88 kg, SpO2 100 %.  Examination General: Well-developed, well-nourished female in no acute distress; appearance consistent with age of record HENT: normocephalic; atraumatic Eyes: pupils equal, round and reactive to light; extraocular muscles intact; right conjunctival injection with mild eyelid edema; no hypopyon; no hyphema; no subconjunctival hemorrhage; no photophobia Neck: supple Heart: regular rate and rhythm Lungs: clear to auscultation bilaterally Abdomen: soft; nondistended; nontender; bowel sounds present Extremities: No deformity; full range of motion; pulses normal Neurologic: Awake, alert and oriented; motor function intact in all extremities and symmetric; no facial droop Skin: Warm and dry Psychiatric: Normal mood and affect   RESULTS  Summary of this visit's results, reviewed and interpreted by myself:   EKG Interpretation  Date/Time:    Ventricular Rate:    PR Interval:    QRS Duration:   QT Interval:    QTC Calculation:   R Axis:     Text Interpretation:        Laboratory Studies: No results found for this or any previous visit (from the past 24 hour(s)). Imaging  Studies: No results found.  ED COURSE and MDM  Nursing notes, initial and subsequent vitals signs, including pulse oximetry, reviewed and interpreted by myself.  Vitals:   06/28/20 2122  BP: (!) 137/110  Pulse: 79  Resp: 16  Temp: 98.5 F (36.9 C)  TempSrc: Oral  SpO2: 100%  Weight: 88 kg  Height: 5\' 3"  (1.6 m)   Medications  ciprofloxacin (CILOXAN) 0.3 % ophthalmic solution 2 drop (has no administration in time range)    We will treat with topical antibiotic drops and have her follow-up with ophthalmology tomorrow.  Presentation is consistent with  conjunctivitis.  She was advised not to touch her right eye and then touch her left eye as this could transfer the infection.   PROCEDURES  Procedures   ED DIAGNOSES     ICD-10-CM   1. Acute conjunctivitis of right eye, unspecified acute conjunctivitis type  H10.31        Muskan Bolla, MD 06/28/20 2359

## 2020-06-28 NOTE — ED Triage Notes (Signed)
Patient here POV from Home with Eye Irritation (Right).  Patient has obvious swelling, pain, and redness to R. Eye.  No Recent Trauma but Patient delivers Packages.  Ambulatory, NAD, GCS 15.

## 2020-06-30 NOTE — Progress Notes (Signed)
    SUBJECTIVE:   CHIEF COMPLAINT / HPI: pelvic exam and follow up for trichomonas infection  Patient treated for trichomonas on 4/21.  She reports that she was able to complete her antibiotic treatment for this.  Patient presents today for follow-up exam and TOC.  She reports that she has often had recurrent episodes of bacterial vaginosis.  She is concerned that she is currently having an episode now as her urine has a "strong odor".  She denies any abnormal vaginal bleeding unless she is late for her Depo-Provera injection.  Patient states that she has noticed minimal vaginal discharge that is white in color.  She reports that she has been sexually active and requests STI testing for gonorrhea and chlamydia today.  Patient states that she has tried modifying lifestyle to try and prevent bacterial vaginosis recurrences.  She is also requesting information from previous lab work including lipid panel, metabolic panel and hemoglobin K5L.  PERTINENT  PMH / PSH:  Depression Menorrhagia   OBJECTIVE:   BP 106/78   Ht 5\' 3"  (1.6 m)   Wt 195 lb 6.4 oz (88.6 kg)   BMI 34.61 kg/m   General: female appearing stated age in no acute distress Cardio: Normal S1 and S2, no S3 or S4. Rhythm is regular.  Pulm: Clear to auscultation bilaterally, no crackles, wheezing, or diminished breath sounds. Normal respiratory effort Abdomen: Bowel sounds normal. Abdomen soft and non-tender.   Genitalia:  Normal introitus for age, no external lesions, scant gray vaginal discharge, mucosa pink and moist, no vaginal or cervical lesions, no vaginal atrophy, no friaility or hemorrhage  ASSESSMENT/PLAN:   Vaginal discharge Patient with vaginal discharge and strong urinary odor for last week and a half. Also recently treated for trichomonas on 4/21.  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  Patient may have bacterial vaginitis or urinary tract infection based on symptoms reported. -Gonorrhea and  Chlamydia testing today -Wet prep -Urine analysis -Patient to follow-up in 2-3 weeks to discuss recurrent bacterial vaginosis -We will follow-up with results once available today.     5/21, MD [facility]

## 2020-07-02 ENCOUNTER — Encounter: Payer: Self-pay | Admitting: Family Medicine

## 2020-07-02 ENCOUNTER — Ambulatory Visit (INDEPENDENT_AMBULATORY_CARE_PROVIDER_SITE_OTHER): Payer: Managed Care, Other (non HMO) | Admitting: Family Medicine

## 2020-07-02 ENCOUNTER — Other Ambulatory Visit: Payer: Self-pay

## 2020-07-02 ENCOUNTER — Other Ambulatory Visit (HOSPITAL_COMMUNITY)
Admission: RE | Admit: 2020-07-02 | Discharge: 2020-07-02 | Disposition: A | Discharge status: Home / Self Care | Payer: Managed Care, Other (non HMO) | Source: Ambulatory Visit | Attending: Family Medicine | Admitting: Family Medicine

## 2020-07-02 VITALS — BP 106/78 | Ht 63.0 in | Wt 195.4 lb

## 2020-07-02 DIAGNOSIS — N898 Other specified noninflammatory disorders of vagina: Secondary | ICD-10-CM

## 2020-07-02 DIAGNOSIS — A5901 Trichomonal vulvovaginitis: Secondary | ICD-10-CM | POA: Diagnosis not present

## 2020-07-02 DIAGNOSIS — R8290 Unspecified abnormal findings in urine: Secondary | ICD-10-CM

## 2020-07-02 LAB — POCT WET PREP (WET MOUNT)
Clue Cells Wet Prep Whiff POC: NEGATIVE
Trichomonas Wet Prep HPF POC: ABSENT
WBC, Wet Prep HPF POC: >20

## 2020-07-02 LAB — POCT UA - MICROSCOPIC ONLY

## 2020-07-02 LAB — POCT URINALYSIS DIP (MANUAL ENTRY)
Bilirubin, UA: NEGATIVE
Glucose, UA: NEGATIVE mg/dL
Ketones, POC UA: NEGATIVE mg/dL
Nitrite, UA: NEGATIVE
Protein Ur, POC: NEGATIVE mg/dL
Spec Grav, UA: 1.02 (ref 1.010–1.025)
Urobilinogen, UA: 0.2 E.U./dL
pH, UA: 6 (ref 5.0–8.0)

## 2020-07-02 MED ORDER — CEPHALEXIN 500 MG PO CAPS
500.0000 mg | ORAL_CAPSULE | Freq: Three times a day (TID) | ORAL | 0 refills | Status: DC
Start: 2020-07-02 — End: 2021-03-19

## 2020-07-02 MED ORDER — FLUCONAZOLE 150 MG PO TABS: 150.0000 mg | ORAL_TABLET | Freq: Once | ORAL | 0 refills | Status: AC

## 2020-07-02 NOTE — Patient Instructions (Addendum)
It was a pleasure to see you today!  Thank you for choosing [facility] for your primary care.    Julie Hawkins was seen for follow up for vaginitis.   Our plans for today were:  Completed STI testing and a vaginal swab to look for yeast of BV.  If any of your results are abnormal, I will notify you and call you to discuss neck steps.  I have reviewed your lab work and everything looks normal as far as kidney function.   Your hemoglobin A1c which measures your average blood sugar for the last 3 months was 5.6.  This is below the range for diabetes which would be 6.5 or above.  I also reviewed her cholesterol results and they were well within normal limits and he did not need any cholesterol-lowering medications at this time.  To keep you healthy, please keep in mind the following health maintenance items that you are due for:   1. COVID vaccine    You should return to our clinic in 2-3 weeks for recurrent bacterial vaginosis follow up.  Best Wishes,   Dr. Neita Garnet      Bacterial Vaginosis  Bacterial vaginosis is an infection of the vagina. It happens when too many normal germs (healthy bacteria) grow in the vagina. This infection can make it easier to get other infections from sex (STIs). It is very important for pregnant women to get treated. This infection can cause babies to be born early or at a low birth weight. What are the causes? This infection is caused by an increase in certain germs that grow in the vagina. You cannot get this infection from toilet seats, bedsheets, swimming pools, or things that touch your vagina. What increases the risk?  Having sex with a new person or more than one person.  Having sex without protection.  Douching.  Having an intrauterine device (IUD).  Smoking.  Using drugs or drinking alcohol. These can lead you to do things that are risky.  Taking certain antibiotic medicines.  Being pregnant. What are the  signs or symptoms? Some women have no symptoms. Symptoms may include:  A discharge from your vagina. It may be gray or white. It can be watery or foamy.  A fishy smell. This can happen after sex or during your menstrual period.  Itching in and around your vagina.  A feeling of burning or pain when you pee (urinate). How is this treated? This infection is treated with antibiotic medicines. These may be given to you as:  A pill.  A cream for your vagina.  A medicine that you put into your vagina (suppository). If the infection comes back after treatment, you may need more antibiotics. Follow these instructions at home: Medicines  Take over-the-counter and prescription medicines as told by your doctor.  Take or use your antibiotic medicine as told by your doctor. Do not stop taking or using it, even if you start to feel better. General instructions  If the person you have sex with is a woman, tell her that you have this infection. She will need to follow up with her doctor. If you have a female partner, he does not need to be treated.  Do not have sex until you finish treatment.  Drink enough fluid to keep your pee pale yellow.  Keep your vagina and butt clean. ? Wash the area with warm water each day. ? Wipe from front to back after you use the toilet.  If  you are breastfeeding a baby, ask your doctor if you should keep doing so during treatment.  Keep all follow-up visits. How is this prevented? Self-care  Do not douche.  Use only warm water to wash around your vagina.  Wear underwear that is cotton or lined with cotton.  Do not wear tight pants and pantyhose, especially in the summer. Safe sex  Use protection when you have sex. This includes: ? Use condoms. ? Use dental dams. This is a thin layer that protects the mouth during oral sex.  Limit how many people you have sex with. To prevent this infection, it is best to have sex with just one person.  Get tested  for STIs. The person you have sex with should also get tested. Drugs and alcohol  Do not smoke or use any products that contain nicotine or tobacco. If you need help quitting, ask your doctor.  Do not use drugs.  Do not drink alcohol if: ? Your doctor tells you not to drink. ? You are pregnant, may be pregnant, or are planning to become pregnant.  If you drink alcohol: ? Limit how much you have to 0-1 drink a day. ? Know how much alcohol is in your drink. In the [place] one drink equals one 12 oz bottle of beer (355 mL), one 5 oz glass of wine (148 mL), or one 1 oz glass of hard liquor (44 mL). Where to find more information  Centers for Disease Control and Prevention: FootballExhibition.com.br  American Sexual Health Association: www.ashastd.org  Office on [employer] [facility]: http://hoffman.com/ Contact a doctor if:  Your symptoms do not get better, even after you are treated.  You have more discharge or pain when you pee.  You have a fever or chills.  You have pain in your belly (abdomen) or in the area between your hips.  You have pain with sex.  You bleed from your vagina between menstrual periods. Summary  This infection can happen when too many germs (bacteria) grow in the vagina.  This infection can make it easier to get infections from sex (STIs). Treating this can lower that chance.  Get treated if you are pregnant. This infection can cause babies to be born early.  Do not stop taking or using your antibiotic medicine, even if you start to feel better. This information is not intended to replace advice given to you by your health care provider. Make sure you discuss any questions you have with your health care provider. Document Revised: 08/04/2019 Document Reviewed: 08/04/2019 Elsevier Patient Education  2021 [employer].

## 2020-07-02 NOTE — Assessment & Plan Note (Signed)
Patient with vaginal discharge and strong urinary odor for last week and a half. Also recently treated for trichomonas on 4/21.  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  Patient may have bacterial vaginitis or urinary tract infection based on symptoms reported. -Gonorrhea and Chlamydia testing today -Wet prep -Urine analysis -Patient to follow-up in 2-3 weeks to discuss recurrent bacterial vaginosis -We will follow-up with results once available today.

## 2020-07-03 LAB — CERVICOVAGINAL ANCILLARY ONLY
Chlamydia: NEGATIVE
Comment: NEGATIVE
Comment: NORMAL
Neisseria Gonorrhea: NEGATIVE

## 2020-07-05 ENCOUNTER — Encounter: Payer: Self-pay | Admitting: Family Medicine

## 2020-09-03 ENCOUNTER — Ambulatory Visit: Payer: Managed Care, Other (non HMO)

## 2020-09-04 ENCOUNTER — Other Ambulatory Visit: Payer: Self-pay

## 2020-09-04 ENCOUNTER — Ambulatory Visit (INDEPENDENT_AMBULATORY_CARE_PROVIDER_SITE_OTHER): Payer: Managed Care, Other (non HMO)

## 2020-09-04 DIAGNOSIS — Z3042 Encounter for surveillance of injectable contraceptive: Secondary | ICD-10-CM

## 2020-09-04 LAB — POCT URINE PREGNANCY: Preg Test, Ur: NEGATIVE

## 2020-09-04 MED ORDER — MEDROXYPROGESTERONE ACETATE 150 MG/ML IM SUSP
150.0000 mg | Freq: Once | INTRAMUSCULAR | Status: AC
  Administered 2020-09-04: 150 mg via INTRAMUSCULAR

## 2020-09-04 NOTE — Progress Notes (Signed)
Patient here today for Depo Provera injection and is not within her dates.  Urine pregnancy obtained and was negative. Patient denies sexual activity within the last two weeks.   Last contraceptive appt was 03/15/2020  Depo given in [place] today.  Site unremarkable & patient tolerated injection.    Next injection due 10/4-10/18/22.  Reminder card given.    Veronda Prude, RN

## 2020-11-20 ENCOUNTER — Ambulatory Visit: Payer: Managed Care, Other (non HMO)

## 2020-12-06 ENCOUNTER — Ambulatory Visit: Payer: Managed Care, Other (non HMO)

## 2020-12-10 ENCOUNTER — Other Ambulatory Visit: Payer: Self-pay

## 2020-12-10 ENCOUNTER — Ambulatory Visit (INDEPENDENT_AMBULATORY_CARE_PROVIDER_SITE_OTHER): Payer: Managed Care, Other (non HMO)

## 2020-12-10 DIAGNOSIS — Z3042 Encounter for surveillance of injectable contraceptive: Secondary | ICD-10-CM | POA: Diagnosis not present

## 2020-12-10 LAB — POCT URINE PREGNANCY: Preg Test, Ur: NEGATIVE

## 2020-12-10 MED ORDER — MEDROXYPROGESTERONE ACETATE 150 MG/ML IM SUSP
150.0000 mg | Freq: Once | INTRAMUSCULAR | Status: AC
  Administered 2020-12-10: 150 mg via INTRAMUSCULAR

## 2020-12-10 NOTE — Progress Notes (Signed)
Patient here today for Depo Provera injection and is not within her dates. Urine pregnancy obtained and was negative  Last contraceptive appt was 03/15/2020  Depo given in [facility] today.  Site unremarkable & patient tolerated injection.    Next injection due 02/25/21-03/11/21.  Reminder card given.    Veronda Prude, RN

## 2020-12-10 NOTE — Progress Notes (Signed)
nne

## 2021-03-19 ENCOUNTER — Encounter (HOSPITAL_COMMUNITY): Payer: Self-pay

## 2021-03-19 ENCOUNTER — Ambulatory Visit (HOSPITAL_COMMUNITY)
Admission: EM | Admit: 2021-03-19 | Discharge: 2021-03-19 | Disposition: A | Discharge status: Home / Self Care | Payer: Managed Care, Other (non HMO) | Attending: Family Medicine | Admitting: Family Medicine

## 2021-03-19 ENCOUNTER — Other Ambulatory Visit: Payer: Self-pay

## 2021-03-19 DIAGNOSIS — E860 Dehydration: Secondary | ICD-10-CM | POA: Diagnosis not present

## 2021-03-19 DIAGNOSIS — K529 Noninfective gastroenteritis and colitis, unspecified: Secondary | ICD-10-CM | POA: Diagnosis not present

## 2021-03-19 MED ORDER — ONDANSETRON 4 MG PO TBDP
4.0000 mg | ORAL_TABLET | Freq: Once | ORAL | Status: AC
  Administered 2021-03-19: 4 mg via ORAL

## 2021-03-19 MED ORDER — SODIUM CHLORIDE 0.9 % IV SOLN: Freq: Once | INTRAVENOUS | Status: AC

## 2021-03-19 MED ORDER — ONDANSETRON 4 MG PO TBDP: 4.0000 mg | ORAL_TABLET | Freq: Three times a day (TID) | ORAL | 0 refills | Status: DC | PRN

## 2021-03-19 MED ORDER — ONDANSETRON HCL 4 MG/2ML IJ SOLN
INTRAMUSCULAR | Status: AC
  Filled 2021-03-19: qty 2

## 2021-03-19 NOTE — ED Triage Notes (Signed)
Pt c/o fever, chills, n/v/d, and weakness since yesterday. States unable to keep anything down.

## 2021-03-19 NOTE — ED Provider Notes (Signed)
[facility]    CSN: [id number] Arrival date & time: 03/19/21  8938      History   Chief Complaint Chief Complaint  Patient presents with   Fever   Nausea    HPI Julie Hawkins is a 41 y.o. female.    Fever Here for vomiting and diarrhea that began yesterday.  She has thrown up at least 8 times more likely 12-15 times in the last 24 hours.  In the last 12 hours every time she has vomited she is also had copious loose stool.  She has not urinated since last evening.  She also has had some subjective fever and chills.  No cough or cold symptoms.  Past Medical History:  Diagnosis Date   Depression     Patient Active Problem List   Diagnosis Date Noted   Encounter for other contraceptive management 03/15/2020   Tobacco abuse counseling 12/29/2011   Menorrhagia 12/29/2011   Vaginal discharge 12/29/2011   Trichomonas vaginalis infection 01/21/2011   DEPRESSION 02/23/2007    Past Surgical History:  Procedure Laterality Date   ENDOMETRIAL ABLATION      OB History   No obstetric history on file.      Home Medications    Prior to Admission medications   Medication Sig Start Date End Date Taking? Authorizing Provider  ondansetron (ZOFRAN-ODT) 4 MG disintegrating tablet Take 1 tablet (4 mg total) by mouth every 8 (eight) hours as needed for nausea or vomiting. 03/19/21  Yes Riaan Toledo, Janace Aris, MD  acetaminophen (TYLENOL) 500 MG tablet Take 1,000 mg by mouth every 6 (six) hours as needed for headache (pain).    [provider]  Multiple Vitamin (MULTIVITAMIN WITH MINERALS) TABS tablet Take 1 tablet by mouth daily.    [provider]    Family History Family History  Problem Relation Age of Onset   Cancer Mother 54       breast cancer   Diabetes Maternal Grandmother    Diabetes Paternal Grandmother     Social History Social History   Tobacco Use   Smoking status: Former    Packs/day: 1.00    Types: Cigarettes    Quit date:  05/05/2020    Years since quitting: 0.8   Smokeless tobacco: Never   Tobacco comments:    smoke black n mild a pack every 2 days  Substance Use Topics   Alcohol use: No   Drug use: No     Allergies   Patient has no known allergies.   Review of Systems Review of Systems  Constitutional:  Positive for fever.    Physical Exam Triage Vital Signs ED Triage Vitals  Enc Vitals Group     BP 03/19/21 1001 (!) 142/98     Pulse Rate 03/19/21 1001 (!) 103     Resp 03/19/21 1001 18     Temp 03/19/21 1001 99.5 F (37.5 C)     Temp Source 03/19/21 1001 Oral     SpO2 03/19/21 1001 100 %     Weight --      Height --      Head Circumference --      Peak Flow --      Pain Score 03/19/21 1002 0     Pain Loc --      Pain Edu? --      Excl. in GC? --    No data found.  Updated Vital Signs BP (!) 142/98 (BP Location: Left Arm)  Pulse (!) 103    Temp 99.5 F (37.5 C) (Oral)    Resp 18    SpO2 100%   Visual Acuity Right Eye Distance:   Left Eye Distance:   Bilateral Distance:    Right Eye Near:   Left Eye Near:    Bilateral Near:     Physical Exam Vitals reviewed.  Constitutional:      General: She is not in acute distress.    Appearance: She is not toxic-appearing.  HENT:     Nose: Nose normal.     Mouth/Throat:     Comments: MM dry and cracking noted at corners of lips  Eyes:     Extraocular Movements: Extraocular movements intact.     Pupils: Pupils are equal, round, and reactive to light.  Cardiovascular:     Rate and Rhythm: Normal rate and regular rhythm.     Heart sounds: No murmur heard. Pulmonary:     Effort: Pulmonary effort is normal.     Breath sounds: Normal breath sounds.  Abdominal:     Palpations: Abdomen is soft. There is no mass.     Tenderness: There is no abdominal tenderness.  Musculoskeletal:     Cervical back: Neck supple.  Lymphadenopathy:     Cervical: No cervical adenopathy.  Skin:    Coloration: Skin is not jaundiced or pale.   Neurological:     General: No focal deficit present.     Mental Status: She is alert and oriented to person, place, and time.  Psychiatric:        Behavior: Behavior normal.     UC Treatments / Results  Labs (all labs ordered are listed, but only abnormal results are displayed) Labs Reviewed - No data to display  EKG   Radiology No results found.  Procedures Procedures (including critical care time)  Medications Ordered in UC Medications  0.9 %  sodium chloride infusion ( Intravenous New Bag/Given 03/19/21 1040)  ondansetron (ZOFRAN-ODT) disintegrating tablet 4 mg (4 mg Oral Given 03/19/21 1040)    Initial Impression / Assessment and Plan / UC Course  I have reviewed the triage vital signs and the nursing notes.  Pertinent labs & imaging results that were available during my care of the patient were reviewed by me and considered in my medical decision making (see chart for details).     After 750 ml approx of IV NS, pt feels better. Nausea is also better after the zofran. Final Clinical Impressions(s) / UC Diagnoses   Final diagnoses:  Gastroenteritis  Dehydration     Discharge Instructions      You received about 750 mL of IV fluids.  Ondansetron dissolved in the mouth every 8 hours as needed for nausea or vomiting. Clear liquids and bland things to eat.      ED Prescriptions     Medication Sig Dispense Auth. Provider   ondansetron (ZOFRAN-ODT) 4 MG disintegrating tablet Take 1 tablet (4 mg total) by mouth every 8 (eight) hours as needed for nausea or vomiting. 10 tablet Marlinda Mike Janace Aris, MD      PDMP not reviewed this encounter.   Zenia Resides, MD 03/19/21 [phone]

## 2021-03-19 NOTE — Discharge Instructions (Signed)
You received about 750 mL of IV fluids.  Ondansetron dissolved in the mouth every 8 hours as needed for nausea or vomiting. Clear liquids and bland things to eat.

## 2021-04-01 ENCOUNTER — Other Ambulatory Visit: Payer: Self-pay

## 2021-04-01 ENCOUNTER — Ambulatory Visit (INDEPENDENT_AMBULATORY_CARE_PROVIDER_SITE_OTHER): Payer: Managed Care, Other (non HMO)

## 2021-04-01 DIAGNOSIS — Z3042 Encounter for surveillance of injectable contraceptive: Secondary | ICD-10-CM

## 2021-04-01 LAB — POCT URINE PREGNANCY: Preg Test, Ur: NEGATIVE

## 2021-04-01 MED ORDER — MEDROXYPROGESTERONE ACETATE 150 MG/ML IM SUSP
150.0000 mg | Freq: Once | INTRAMUSCULAR | Status: AC
  Administered 2021-04-01: 150 mg via INTRAMUSCULAR

## 2021-04-01 NOTE — Progress Notes (Signed)
Patient here today for Depo Provera injection and is not within her dates. Patient states that she has not been sexually active in the last month. Urine pregnancy obtained and was negative.   Last contraceptive appt was 06/07/2020  Depo given in [place] today.  Site unremarkable & patient tolerated injection.    Next injection due 06/17/21-07/01/21.  Reminder card given.    Talbot Grumbling, RN

## 2021-04-24 ENCOUNTER — Ambulatory Visit: Payer: Managed Care, Other (non HMO) | Admitting: Family Medicine

## 2021-04-24 ENCOUNTER — Other Ambulatory Visit: Payer: Self-pay

## 2021-04-24 ENCOUNTER — Ambulatory Visit (HOSPITAL_COMMUNITY)
Admission: EM | Admit: 2021-04-24 | Discharge: 2021-04-24 | Disposition: A | Discharge status: Home / Self Care | Payer: Managed Care, Other (non HMO) | Attending: Nurse Practitioner | Admitting: Nurse Practitioner

## 2021-04-24 ENCOUNTER — Encounter (HOSPITAL_COMMUNITY): Payer: Self-pay | Admitting: Emergency Medicine

## 2021-04-24 DIAGNOSIS — N898 Other specified noninflammatory disorders of vagina: Secondary | ICD-10-CM | POA: Diagnosis not present

## 2021-04-24 DIAGNOSIS — B9689 Other specified bacterial agents as the cause of diseases classified elsewhere: Secondary | ICD-10-CM | POA: Diagnosis not present

## 2021-04-24 DIAGNOSIS — N760 Acute vaginitis: Secondary | ICD-10-CM | POA: Diagnosis not present

## 2021-04-24 DIAGNOSIS — N390 Urinary tract infection, site not specified: Secondary | ICD-10-CM | POA: Insufficient documentation

## 2021-04-24 LAB — POCT URINALYSIS DIPSTICK, ED / UC
Bilirubin Urine: NEGATIVE
Glucose, UA: NEGATIVE mg/dL
Ketones, ur: NEGATIVE mg/dL
Nitrite: NEGATIVE
Protein, ur: NEGATIVE mg/dL
Specific Gravity, Urine: 1.02 (ref 1.005–1.030)
Urobilinogen, UA: 1 mg/dL (ref 0.0–1.0)
pH: 6.5 (ref 5.0–8.0)

## 2021-04-24 LAB — POC URINE PREG, ED: Preg Test, Ur: NEGATIVE

## 2021-04-24 MED ORDER — NITROFURANTOIN MONOHYD MACRO 100 MG PO CAPS: 100.0000 mg | ORAL_CAPSULE | Freq: Two times a day (BID) | ORAL | 0 refills | Status: AC

## 2021-04-24 MED ORDER — METRONIDAZOLE 0.75 % VA GEL: 1.0000 | Freq: Every day | VAGINAL | 0 refills | Status: AC

## 2021-04-24 MED ORDER — FLUCONAZOLE 150 MG PO TABS
150.0000 mg | ORAL_TABLET | Freq: Every day | ORAL | 0 refills | Status: AC
Start: 2021-04-24 — End: 2021-04-26

## 2021-04-24 NOTE — ED Provider Notes (Signed)
?[facility] ? ? ? ?CSN: [id number] ?Arrival date & time: 04/24/21  1634 ? ? ?  ? ?History   ?Chief Complaint ?Chief Complaint  ?Patient presents with  ? Vaginal Discharge  ? ? ?HPI ?Julie Hawkins is a 41 y.o. female.  ? ?The patient is a 41 year old female who presents for vaginal symptoms.  Symptoms started approximately 4 days ago.  She complains of vaginal itching, vaginal odor, vaginal discharge.  She describes the discharge as "green" with a foul-smelling odor that she describes as "fishy".  She also complains of vaginal itching that started about 2 days ago.  She also has complaints of urinary frequency.  States that she has some bilateral pelvic pain.  Her last menstrual cycle was on 2/28.  She has had 1 female partner in the past 90 days with approximately 90% condom use.  No recent history of STI, but states that she had some when she was younger. ? ? ? ?Past Medical History:  ?Diagnosis Date  ? Depression   ? ? ?Patient Active Problem List  ? Diagnosis Date Noted  ? Encounter for other contraceptive management 03/15/2020  ? Tobacco abuse counseling 12/29/2011  ? Menorrhagia 12/29/2011  ? Vaginal discharge 12/29/2011  ? Trichomonas vaginalis infection 01/21/2011  ? DEPRESSION 02/23/2007  ? ? ?Past Surgical History:  ?Procedure Laterality Date  ? ENDOMETRIAL ABLATION    ? ? ?OB History   ?No obstetric history on file. ?  ? ? ? ?Home Medications   ? ?Prior to Admission medications   ?Medication Sig Start Date End Date Taking? Authorizing Provider  ?acetaminophen (TYLENOL) 500 MG tablet Take 1,000 mg by mouth every 6 (six) hours as needed for headache (pain).    [provider]  ?Multiple Vitamin (MULTIVITAMIN WITH MINERALS) TABS tablet Take 1 tablet by mouth daily.    [provider]  ?ondansetron (ZOFRAN-ODT) 4 MG disintegrating tablet Take 1 tablet (4 mg total) by mouth every 8 (eight) hours as needed for nausea or vomiting. 03/19/21   Zenia Resides, MD  ? ? ?Family  History ?Family History  ?Problem Relation Age of Onset  ? Cancer Mother 14  ?     breast cancer  ? Diabetes Maternal Grandmother   ? Diabetes Paternal Grandmother   ? ? ?Social History ?Social History  ? ?Tobacco Use  ? Smoking status: Former  ?  Packs/day: 1.00  ?  Types: Cigarettes  ?  Quit date: 05/05/2020  ?  Years since quitting: 0.9  ? Smokeless tobacco: Never  ? Tobacco comments:  ?  smoke black n mild a pack every 2 days  ?Substance Use Topics  ? Alcohol use: No  ? Drug use: No  ? ? ? ?Allergies   ?Patient has no known allergies. ? ? ?Review of Systems ?Review of Systems  ?Constitutional: Negative.   ?Gastrointestinal: Negative.   ?Genitourinary:  Positive for frequency, pelvic pain and vaginal discharge. Negative for menstrual problem and vaginal bleeding.  ?Skin: Negative.   ?Psychiatric/Behavioral: Negative.    ? ? ?Physical Exam ?Triage Vital Signs ?ED Triage Vitals  ?Enc Vitals Group  ?   BP 04/24/21 1744 (!) 147/98  ?   Pulse Rate 04/24/21 1744 75  ?   Resp 04/24/21 1744 18  ?   Temp 04/24/21 1744 97.6 ?F (36.4 ?C)  ?   Temp Source 04/24/21 1744 Oral  ?   SpO2 04/24/21 1744 98 %  ?   Weight 04/24/21 1741 195 lb  5.2 oz (88.6 kg)  ?   Height 04/24/21 1741 5\' 3"  (1.6 m)  ?   Head Circumference --   ?   Peak Flow --   ?   Pain Score 04/24/21 1741 5  ?   Pain Loc --   ?   Pain Edu? --   ?   Excl. in GC? --   ? ?No data found. ? ?Updated Vital Signs ?BP (!) 147/98 (BP Location: Left Arm)   Pulse 75   Temp 97.6 ?F (36.4 ?C) (Oral)   Resp 18   Ht 5\' 3"  (1.6 m)   Wt 195 lb 5.2 oz (88.6 kg)   SpO2 98%   BMI 34.60 kg/m?  ? ?Visual Acuity ?Right Eye Distance:   ?Left Eye Distance:   ?Bilateral Distance:   ? ?Right Eye Near:   ?Left Eye Near:    ?Bilateral Near:    ? ?Physical Exam ?Vitals reviewed.  ?Constitutional:   ?   General: She is not in acute distress. ?   Appearance: She is normal weight.  ?HENT:  ?   Head: Normocephalic and atraumatic.  ?   Nose: Nose normal.  ?Eyes:  ?   Extraocular Movements:  Extraocular movements intact.  ?   Pupils: Pupils are equal, round, and reactive to light.  ?Cardiovascular:  ?   Rate and Rhythm: Normal rate and regular rhythm.  ?Pulmonary:  ?   Effort: Pulmonary effort is normal.  ?   Breath sounds: Normal breath sounds.  ?Abdominal:  ?   Palpations: Abdomen is soft.  ?   Tenderness: There is no abdominal tenderness.  ?Genitourinary: ?   Comments: Deferred. Patient performed self swab. + pelvic tenderness bilaterally with palpation. ?Skin: ?   General: Skin is warm and dry.  ?Neurological:  ?   Mental Status: She is alert and oriented to person, place, and time.  ?Psychiatric:     ?   Mood and Affect: Mood normal.     ?   Behavior: Behavior normal.  ? ? ? ?UC Treatments / Results  ?Labs ?(all labs ordered are listed, but only abnormal results are displayed) ?Labs Reviewed  ?POC URINE PREG, ED  ?POCT URINALYSIS DIPSTICK, ED / UC  ?CERVICOVAGINAL ANCILLARY ONLY  ? ? ?EKG ? ? ?Radiology ?No results found. ? ?Procedures ?Procedures (including critical care time) ? ?Medications Ordered in UC ?Medications - No data to display ? ?Initial Impression / Assessment and Plan / UC Course  ?I have reviewed the triage vital signs and the nursing notes. ? ?Pertinent labs & imaging results that were available during my care of the patient were reviewed by me and considered in my medical decision making (see chart for details). ? ?Urinalysis shows moderate leukocytes.  We will go ahead and treat patient for UTI based on her symptoms and UA results.  We will send for culture.  We will also treat for bacterial vaginosis and yeast.  No concern for pyelonephritis or acute abdomen.  Will provide a prescription for metronidazole gel, fluconazole, and Macrobid for her urinary symptoms.  Patient encouraged to increase fluids.  We will follow-up with the patient once her STI results are received and treat accordingly if indicated.  Advised to refrain from any sexual activity until after her results are  received.  Follow-up if symptoms worsen. ?Final Clinical Impressions(s) / UC Diagnoses  ? ?Final diagnoses:  ?None  ? ?Discharge Instructions   ?None ?  ? ?ED Prescriptions   ?None ?  ? ?  PDMP not reviewed this encounter. ?  ?Abran CantorLeath-Warren, Nalda Shackleford J, NP ?04/24/21 1845 ? ?

## 2021-04-24 NOTE — Discharge Instructions (Signed)
Reviewed test results with patient.  Your results show you have a urinary tract infection.  We are awaiting the results of your STI testing.  We will also treat you for bacterial vaginosis and yeast. ?Refrain from sexual intercourse until after STI results are received.  If you do not hear from [place] within the next 24 to 48 hours, you may check your MyChart for your results. ?Follow-up if your urinary symptoms worsen to include fever, chills, abdominal pain, or low back pain.  Follow-up as needed. ?

## 2021-04-24 NOTE — ED Triage Notes (Addendum)
Pt reports light green vaginal discharge x 1 week. Requesting STI testing.  ?Also states she believes she has BV, due to recurrent infections and wants diflucan after taking antibiotics.  ?

## 2021-04-25 ENCOUNTER — Telehealth (HOSPITAL_COMMUNITY): Payer: Self-pay | Admitting: Emergency Medicine

## 2021-04-25 LAB — CERVICOVAGINAL ANCILLARY ONLY
Bacterial Vaginitis (gardnerella): POSITIVE — AB
Candida Glabrata: NEGATIVE
Candida Vaginitis: NEGATIVE
Chlamydia: NEGATIVE
Comment: NEGATIVE
Comment: NEGATIVE
Comment: NEGATIVE
Comment: NEGATIVE
Comment: NEGATIVE
Comment: NORMAL
Neisseria Gonorrhea: NEGATIVE
Trichomonas: POSITIVE — AB

## 2021-04-25 MED ORDER — METRONIDAZOLE 500 MG PO TABS: 2000.0000 mg | ORAL_TABLET | Freq: Once | ORAL | 0 refills | Status: AC

## 2021-04-26 LAB — URINE CULTURE: Culture: 80000 — AB

## 2021-05-08 ENCOUNTER — Ambulatory Visit (HOSPITAL_COMMUNITY)
Admission: RE | Admit: 2021-05-08 | Discharge: 2021-05-08 | Disposition: A | Discharge status: Home / Self Care | Payer: Managed Care, Other (non HMO) | Source: Ambulatory Visit | Attending: Family Medicine | Admitting: Family Medicine

## 2021-05-08 ENCOUNTER — Other Ambulatory Visit: Payer: Self-pay

## 2021-05-08 ENCOUNTER — Encounter (HOSPITAL_COMMUNITY): Payer: Self-pay

## 2021-05-08 VITALS — BP 127/93 | HR 82 | Temp 98.2°F | Resp 18 | Ht 63.0 in | Wt 195.3 lb

## 2021-05-08 DIAGNOSIS — N760 Acute vaginitis: Secondary | ICD-10-CM | POA: Insufficient documentation

## 2021-05-08 LAB — POCT URINALYSIS DIPSTICK, ED / UC
Bilirubin Urine: NEGATIVE
Glucose, UA: NEGATIVE mg/dL
Hgb urine dipstick: NEGATIVE
Ketones, ur: NEGATIVE mg/dL
Nitrite: NEGATIVE
Protein, ur: NEGATIVE mg/dL
Specific Gravity, Urine: 1.01 (ref 1.005–1.030)
Urobilinogen, UA: 1 mg/dL (ref 0.0–1.0)
pH: 6.5 (ref 5.0–8.0)

## 2021-05-08 MED ORDER — FLUCONAZOLE 150 MG PO TABS: 150.0000 mg | ORAL_TABLET | Freq: Three times a day (TID) | ORAL | 0 refills | Status: DC | PRN

## 2021-05-08 MED ORDER — NYSTATIN 100000 UNIT/GM EX CREA: TOPICAL_CREAM | CUTANEOUS | 0 refills | Status: DC

## 2021-05-08 MED ORDER — FLUCONAZOLE 150 MG PO TABS
150.0000 mg | ORAL_TABLET | ORAL | 0 refills | Status: DC | PRN
Start: 2021-05-08 — End: 2021-09-11

## 2021-05-08 NOTE — ED Triage Notes (Signed)
Pt reports light green discharge and itching x 1 month. Seen recently for same and was treated with 2000 mg Flagyl and metronidazole gel and symptoms have not resolved.  ?

## 2021-05-08 NOTE — ED Provider Notes (Signed)
?[facility] ? ? ? ?CSN: [id number] ?Arrival date & time: 05/08/21  1809 ? ? ?  ? ?History   ?Chief Complaint ?Chief Complaint  ?Patient presents with  ? Vaginal Discharge  ? ? ?HPI ?Julie Hawkins is a 41 y.o. female.  ? ?HPI ?Patient presents today with vaginitis symptoms.  Patient was seen on 04/24/2021 had vaginal cytology completed which resulted positive for BV and trichomonas.  She was treated with metronidazole gel along with 5 doses of metronidazole and Diflucan.  She took the metronidazole tablets all at once.  She reports now she is having vaginal itching and irritation which is extending from her vaginal area to the lower portion of her rectum.  She denies any offensive odor.  Denies any dysuria.  Endorses that she has not been sexually active with anyone since her diagnosis. ?Past Medical History:  ?Diagnosis Date  ? Depression   ? ? ?Patient Active Problem List  ? Diagnosis Date Noted  ? Encounter for other contraceptive management 03/15/2020  ? Tobacco abuse counseling 12/29/2011  ? Menorrhagia 12/29/2011  ? Vaginal discharge 12/29/2011  ? Trichomonas vaginalis infection 01/21/2011  ? DEPRESSION 02/23/2007  ? ? ?Past Surgical History:  ?Procedure Laterality Date  ? ENDOMETRIAL ABLATION    ? ? ?OB History   ?No obstetric history on file. ?  ? ? ? ?Home Medications   ? ?Prior to Admission medications   ?Medication Sig Start Date End Date Taking? Authorizing Provider  ?nystatin cream (MYCOSTATIN) Apply to affected area 2 times daily 05/08/21  Yes Bing Neighbors, FNP  ?acetaminophen (TYLENOL) 500 MG tablet Take 1,000 mg by mouth every 6 (six) hours as needed for headache (pain).    [provider]  ?fluconazole (DIFLUCAN) 150 MG tablet Take 1 tablet (150 mg total) by mouth every three (3) days as needed. 05/08/21   Bing Neighbors, FNP  ?metroNIDAZOLE (FLAGYL) 500 MG tablet Take 1 tablet (500 mg total) by mouth 2 (two) times daily. 05/10/21   LampteyBritta Mccreedy, MD  ?Multiple  Vitamin (MULTIVITAMIN WITH MINERALS) TABS tablet Take 1 tablet by mouth daily.    [provider]  ?ondansetron (ZOFRAN-ODT) 4 MG disintegrating tablet Take 1 tablet (4 mg total) by mouth every 8 (eight) hours as needed for nausea or vomiting. 03/19/21   Zenia Resides, MD  ? ? ?Family History ?Family History  ?Problem Relation Age of Onset  ? Cancer Mother 88  ?     breast cancer  ? Diabetes Maternal Grandmother   ? Diabetes Paternal Grandmother   ? ? ?Social History ?Social History  ? ?Tobacco Use  ? Smoking status: Former  ?  Packs/day: 1.00  ?  Types: Cigarettes  ?  Quit date: 05/05/2020  ?  Years since quitting: 1.0  ? Smokeless tobacco: Never  ? Tobacco comments:  ?  smoke black n mild a pack every 2 days  ?Substance Use Topics  ? Alcohol use: No  ? Drug use: No  ? ? ? ?Allergies   ?Patient has no known allergies. ? ? ?Review of Systems ?Review of Systems ?Pertinent negatives listed in HPI  ? ?Physical Exam ?Triage Vital Signs ?ED Triage Vitals  ?Enc Vitals Group  ?   BP 05/08/21 1827 (!) 127/93  ?   Pulse Rate 05/08/21 1827 82  ?   Resp 05/08/21 1827 18  ?   Temp 05/08/21 1827 98.2 ?F (36.8 ?C)  ?   Temp Source 05/08/21 1827 Oral  ?  SpO2 05/08/21 1827 98 %  ?   Weight 05/08/21 1826 195 lb 5.2 oz (88.6 kg)  ?   Height 05/08/21 1826 5\' 3"  (1.6 m)  ?   Head Circumference --   ?   Peak Flow --   ?   Pain Score 05/08/21 1825 0  ?   Pain Loc --   ?   Pain Edu? --   ?   Excl. in GC? --   ? ?No data found. ? ?Updated Vital Signs ?BP (!) 127/93 (BP Location: Right Arm)   Pulse 82   Temp 98.2 ?F (36.8 ?C) (Oral)   Resp 18   Ht 5\' 3"  (1.6 m)   Wt 195 lb 5.2 oz (88.6 kg)   SpO2 98%   BMI 34.60 kg/m?  ? ?Visual Acuity ?Right Eye Distance:   ?Left Eye Distance:   ?Bilateral Distance:   ? ?Right Eye Near:   ?Left Eye Near:    ?Bilateral Near:    ? ?Physical Exam ?Constitutional:   ?   Appearance: Normal appearance.  ?HENT:  ?   Head: Normocephalic and atraumatic.  ?Eyes:  ?   Extraocular Movements:  Extraocular movements intact.  ?   Pupils: Pupils are equal, round, and reactive to light.  ?Cardiovascular:  ?   Rate and Rhythm: Normal rate and regular rhythm.  ?Pulmonary:  ?   Effort: Pulmonary effort is normal.  ?   Breath sounds: Normal breath sounds.  ?Genitourinary: ?   Comments: Vaginal cytology self-collected ?Musculoskeletal:  ?   Cervical back: Normal range of motion.  ?Skin: ?   General: Skin is warm and dry.  ?   Capillary Refill: Capillary refill takes less than 2 seconds.  ?Neurological:  ?   General: No focal deficit present.  ?   Mental Status: She is alert.  ?Psychiatric:     ?   Mood and Affect: Mood normal.  ? ?UC Treatments / Results  ?Labs ?(all labs ordered are listed, but only abnormal results are displayed) ?Labs Reviewed  ?POCT URINALYSIS DIPSTICK, ED / UC - Abnormal; Notable for the following components:  ?    Result Value  ? Leukocytes,Ua TRACE (*)   ? All other components within normal limits  ?CERVICOVAGINAL ANCILLARY ONLY - Abnormal; Notable for the following components:  ? Bacterial Vaginitis (gardnerella) Positive (*)   ? All other components within normal limits  ? ? ?EKG ? ? ?Radiology ?No results found. ? ?Procedures ?Procedures (including critical care time) ? ?Medications Ordered in UC ?Medications - No data to display ? ?Initial Impression / Assessment and Plan / UC Course  ?I have reviewed the triage vital signs and the nursing notes. ? ?Pertinent labs & imaging results that were available during my care of the patient were reviewed by me and considered in my medical decision making (see chart for details). ? ?  ?Treating for vaginitis with nystatin cream twice daily as needed along with Diflucan.  Repeat vaginal cytology which is pending. ?Return if prescribed treatment fails to improve symptoms. ?Final Clinical Impressions(s) / UC Diagnoses  ? ?Final diagnoses:  ?Vaginitis and vulvovaginitis  ? ? ? ?Discharge Instructions   ? ?  ?Vaginal cytology will result within 24  hours.  For now apply nystatin cream for vaginal irritation and take Diflucan 1 tablet every 3 days as needed until vaginal itching resolves.  If any additional treatment is warranted following receipt of your vaginal cytology results we will notify you by phone. ? ? ? ?  ED Prescriptions   ? ? Medication Sig Dispense Auth. Provider  ? nystatin cream (MYCOSTATIN) Apply to affected area 2 times daily 90 g Bing NeighborsHarris, Deolinda Frid S, FNP  ? fluconazole (DIFLUCAN) 150 MG tablet  (Status: Discontinued) Take 1 tablet (150 mg total) by mouth 3 (three) times daily as needed. 3 tablet Bing NeighborsHarris, Malanie Koloski S, FNP  ? fluconazole (DIFLUCAN) 150 MG tablet Take 1 tablet (150 mg total) by mouth every three (3) days as needed. 3 tablet Bing NeighborsHarris, Kamaree Wheatley S, FNP  ? ?  ? ?PDMP not reviewed this encounter. ?  ?Bing NeighborsHarris, Nicoles Sedlacek S, FNP ?05/13/21 1705 ? ?

## 2021-05-08 NOTE — Discharge Instructions (Addendum)
Vaginal cytology will result within 24 hours.  For now apply nystatin cream for vaginal irritation and take Diflucan 1 tablet every 3 days as needed until vaginal itching resolves.  If any additional treatment is warranted following receipt of your vaginal cytology results we will notify you by phone. ?

## 2021-05-09 LAB — CERVICOVAGINAL ANCILLARY ONLY
Bacterial Vaginitis (gardnerella): POSITIVE — AB
Candida Glabrata: NEGATIVE
Candida Vaginitis: NEGATIVE
Chlamydia: NEGATIVE
Comment: NEGATIVE
Comment: NEGATIVE
Comment: NEGATIVE
Comment: NEGATIVE
Comment: NEGATIVE
Comment: NORMAL
Neisseria Gonorrhea: NEGATIVE
Trichomonas: NEGATIVE

## 2021-05-10 ENCOUNTER — Telehealth (HOSPITAL_COMMUNITY): Payer: Self-pay | Admitting: Emergency Medicine

## 2021-05-10 MED ORDER — METRONIDAZOLE 500 MG PO TABS: 500.0000 mg | ORAL_TABLET | Freq: Two times a day (BID) | ORAL | 0 refills | Status: DC

## 2021-06-25 ENCOUNTER — Ambulatory Visit (INDEPENDENT_AMBULATORY_CARE_PROVIDER_SITE_OTHER): Payer: Managed Care, Other (non HMO)

## 2021-06-25 DIAGNOSIS — Z308 Encounter for other contraceptive management: Secondary | ICD-10-CM

## 2021-06-25 MED ORDER — MEDROXYPROGESTERONE ACETATE 150 MG/ML IM SUSP
150.0000 mg | Freq: Once | INTRAMUSCULAR | Status: AC
  Administered 2021-06-25: 150 mg via INTRAMUSCULAR

## 2021-06-25 NOTE — Progress Notes (Signed)
Patient here today for Depo Provera injection. ? ?Last contraceptive appt was 06/07/2020. ?  ?Depo given in LUOQ today. Site unremarkable & patient tolerated injection.   ?  ?Next injection due 09/10/2021-09/24/2021. Reminder card given.   ? ?Patient scheduled for PCP contraceptive apt for 07/22/2021. ? ? ?

## 2021-07-22 ENCOUNTER — Ambulatory Visit: Payer: Managed Care, Other (non HMO) | Admitting: Family Medicine

## 2021-07-28 NOTE — Progress Notes (Deleted)
    SUBJECTIVE:   CHIEF COMPLAINT / HPI: discuss depo provera  Patient presents for discussion regarding depo provera injections.  She states ***   Side effects may include dysfunctional uterine bleeding, headache, increased weight, amenorrhea, and injection site reactions***   PERTINENT  PMH / PSH: ***  OBJECTIVE:   There were no vitals taken for this visit.  Physical Exam   ASSESSMENT/PLAN:   No problem-specific Assessment & Plan notes found for this encounter.     Ronnald Ramp, MD [facility]

## 2021-07-29 ENCOUNTER — Ambulatory Visit: Payer: Managed Care, Other (non HMO) | Admitting: Family Medicine

## 2021-09-02 ENCOUNTER — Other Ambulatory Visit (HOSPITAL_COMMUNITY)
Admission: RE | Admit: 2021-09-02 | Discharge: 2021-09-02 | Disposition: A | Discharge status: Home / Self Care | Payer: Managed Care, Other (non HMO) | Source: Ambulatory Visit | Attending: Family Medicine | Admitting: Family Medicine

## 2021-09-02 ENCOUNTER — Ambulatory Visit: Payer: Managed Care, Other (non HMO) | Admitting: Family Medicine

## 2021-09-02 VITALS — BP 123/94 | HR 93 | Ht 63.0 in | Wt 199.0 lb

## 2021-09-02 DIAGNOSIS — Z124 Encounter for screening for malignant neoplasm of cervix: Secondary | ICD-10-CM

## 2021-09-02 DIAGNOSIS — G43109 Migraine with aura, not intractable, without status migrainosus: Secondary | ICD-10-CM

## 2021-09-02 DIAGNOSIS — R7303 Prediabetes: Secondary | ICD-10-CM | POA: Diagnosis not present

## 2021-09-02 DIAGNOSIS — Z113 Encounter for screening for infections with a predominantly sexual mode of transmission: Secondary | ICD-10-CM | POA: Diagnosis not present

## 2021-09-02 DIAGNOSIS — A5901 Trichomonal vulvovaginitis: Secondary | ICD-10-CM | POA: Diagnosis not present

## 2021-09-02 DIAGNOSIS — I10 Essential (primary) hypertension: Secondary | ICD-10-CM

## 2021-09-02 DIAGNOSIS — G43909 Migraine, unspecified, not intractable, without status migrainosus: Secondary | ICD-10-CM | POA: Insufficient documentation

## 2021-09-02 LAB — POCT WET PREP (WET MOUNT)
Clue Cells Wet Prep Whiff POC: NEGATIVE
Trichomonas Wet Prep HPF POC: ABSENT

## 2021-09-02 LAB — POCT GLYCOSYLATED HEMOGLOBIN (HGB A1C): Hemoglobin A1C: 5.2 % (ref 4.0–5.6)

## 2021-09-02 MED ORDER — PROPRANOLOL HCL ER 80 MG PO CP24: 80.0000 mg | ORAL_CAPSULE | Freq: Two times a day (BID) | ORAL | 1 refills | Status: DC

## 2021-09-02 NOTE — Progress Notes (Signed)
    SUBJECTIVE:   CHIEF COMPLAINT / HPI: Pap, STI, HTN  Julie Hawkins is 41 yo F p/f Pap and STI testing. Pt is also concerned about diabetes because both parents had it and she also feels like her wounds take longer to heal. She has also noticed dark skin around neck and waist. She is also worried about her BP being too high.   Pt also reports 2.5 wk hx of migraines that occur almost daily. Pt reports visual changes preceding migraines. Migraines are "pounding", start on L side, then moves to involve right side too. Pt has been taking tylenol, which controls migraine for a few hrs before she needs to take more. She is concerned of dependency on tylenol.  PERTINENT  PMH / PSH: reviewed  OBJECTIVE:   BP (!) 123/94   Pulse 93   Ht 5\' 3"  (1.6 m)   Wt 199 lb (90.3 kg)   SpO2 100%   BMI 35.25 kg/m   Gen: Friendly well-appearing woman, NAD Pulm: Normal WOB GU: Normal external female genitalia. No vaginal discharge.   ASSESSMENT/PLAN:   Pap smear for cervical cancer screening Pap smear done today  Routine screening for STI (sexually transmitted infection) Wet prep and RPR obtained today.  Migraines 2.5 wk hx of almost-daily migraines. They are preceded by vision changes, "pounding" in nature, and starts on L side and moves to involve R side. Tylenol helps the pain. Given frequency of HA, will start prophylactic therapy. - Start Propanolol ER 80mg  BID - F/u 1 month  HTN (hypertension) BP elevated at past few visits this year (especially diastolic). BP 123/94, above goal. Pt agreeable to start meds. - Propanolol as above - F/u in 1 month. Consider additional agents.  , MD [facility]

## 2021-09-02 NOTE — Assessment & Plan Note (Signed)
Wet prep and RPR obtained today.

## 2021-09-02 NOTE — Patient Instructions (Signed)
Good to see you today - Thank you for coming in  Things we discussed today:  For High Blood Pressure, start Propanolol 1 tablet twice a day.   For your migraines, the propanolol will also help prevent them. Continue to take Tylenol as needed for acute migraines.   I will call you with any abnormal lab results. If they are normal, I will send you a letter.  Come back to see me in 1 month to follow-up blood pressure and migraines.

## 2021-09-02 NOTE — Assessment & Plan Note (Signed)
2.5 wk hx of almost-daily migraines. They are preceded by vision changes, "pounding" in nature, and starts on L side and moves to involve R side. Tylenol helps the pain. Given frequency of HA, will start prophylactic therapy. - Start Propanolol ER 80mg  BID - F/u 1 month

## 2021-09-02 NOTE — Assessment & Plan Note (Signed)
Pap smear done today

## 2021-09-02 NOTE — Assessment & Plan Note (Signed)
BP elevated at past few visits this year (especially diastolic). BP 123/94, above goal. Pt agreeable to start meds. - Propanolol as above - F/u in 1 month. Consider additional agents.

## 2021-09-03 ENCOUNTER — Encounter: Payer: Self-pay | Admitting: Family Medicine

## 2021-09-03 LAB — RPR: RPR Ser Ql: NONREACTIVE

## 2021-09-10 ENCOUNTER — Ambulatory Visit: Payer: Self-pay

## 2021-09-10 ENCOUNTER — Ambulatory Visit: Payer: Managed Care, Other (non HMO)

## 2021-09-10 LAB — CYTOLOGY - PAP
Chlamydia: NEGATIVE
Comment: NEGATIVE
Comment: NEGATIVE
Comment: NEGATIVE
Comment: NORMAL
Diagnosis: UNDETERMINED — AB
High risk HPV: NEGATIVE
Neisseria Gonorrhea: NEGATIVE
Trichomonas: POSITIVE — AB

## 2021-09-11 ENCOUNTER — Telehealth: Payer: Self-pay | Admitting: Family Medicine

## 2021-09-11 MED ORDER — FLUCONAZOLE 150 MG PO TABS: 150.0000 mg | ORAL_TABLET | Freq: Once | ORAL | 1 refills | Status: AC

## 2021-09-11 MED ORDER — METRONIDAZOLE 500 MG PO TABS: 500.0000 mg | ORAL_TABLET | Freq: Two times a day (BID) | ORAL | 0 refills | Status: AC

## 2021-09-11 NOTE — Telephone Encounter (Signed)
Spoke with her Trichomonas - Recommend take 5 days for flagyl for trich and to have partner treated - Rx for diflucan once finished  ASCUS Recommend needs follow up PAP in 3 years

## 2021-09-16 ENCOUNTER — Ambulatory Visit (INDEPENDENT_AMBULATORY_CARE_PROVIDER_SITE_OTHER): Payer: Managed Care, Other (non HMO)

## 2021-09-16 ENCOUNTER — Telehealth: Payer: Self-pay

## 2021-09-16 DIAGNOSIS — Z3042 Encounter for surveillance of injectable contraceptive: Secondary | ICD-10-CM | POA: Diagnosis not present

## 2021-09-16 MED ORDER — MEDROXYPROGESTERONE ACETATE 150 MG/ML IM SUSP
150.0000 mg | Freq: Once | INTRAMUSCULAR | Status: AC
  Administered 2021-09-16: 150 mg via INTRAMUSCULAR

## 2021-09-16 NOTE — Progress Notes (Signed)
Patient here today for Depo Provera injection and is within her dates.    Last contraceptive appt was 09/02/2021  Depo given in RUOQ today.  Site unremarkable & patient tolerated injection.    Next injection due 12/02/21-12/16/2021.  Reminder card given.    Veronda Prude, RN

## 2021-09-16 NOTE — Telephone Encounter (Signed)
Patient in nurse clinic today for depo injection reports adverse side effects to flagyl. Patient recently tested positive for trichomonas on 7/17.  Patient reports starting medication on Friday and has been vomiting about 30 minutes after taking medication. Patient is requesting an alternative. Patient also reports that she takes medication with food and she still experiences GI upset.   Will forward to prescribing doctor.   Veronda Prude, RN

## 2021-09-17 NOTE — Telephone Encounter (Signed)
Pls let her know there is no good alternative to flagyl.  She should stop the medication for 5 days and then retry with a little food.  She can NOT drink any alcohol 24 hours before starting and after finishing.  If she can not tolerate the medication she should make an office visit   Thanks  LC

## 2021-09-17 NOTE — Telephone Encounter (Signed)
Called patient and she is willing to make another appointment to be reevaluated.    Patient states that she "does not want to continue Flagyl" as it always makes her sick regardless to what she does.  Glennie Hawk, CMA

## 2021-09-24 ENCOUNTER — Other Ambulatory Visit: Payer: Self-pay | Admitting: Family Medicine

## 2021-12-02 ENCOUNTER — Other Ambulatory Visit (HOSPITAL_COMMUNITY)
Admission: RE | Admit: 2021-12-02 | Discharge: 2021-12-02 | Disposition: A | Discharge status: Home / Self Care | Payer: Managed Care, Other (non HMO) | Source: Ambulatory Visit | Attending: Family Medicine | Admitting: Family Medicine

## 2021-12-02 ENCOUNTER — Ambulatory Visit (INDEPENDENT_AMBULATORY_CARE_PROVIDER_SITE_OTHER): Payer: Managed Care, Other (non HMO) | Admitting: Student

## 2021-12-02 ENCOUNTER — Encounter: Payer: Self-pay | Admitting: Student

## 2021-12-02 VITALS — BP 132/71 | HR 87 | Ht 63.0 in | Wt 196.2 lb

## 2021-12-02 DIAGNOSIS — Z309 Encounter for contraceptive management, unspecified: Secondary | ICD-10-CM | POA: Diagnosis not present

## 2021-12-02 DIAGNOSIS — N898 Other specified noninflammatory disorders of vagina: Secondary | ICD-10-CM

## 2021-12-02 DIAGNOSIS — I10 Essential (primary) hypertension: Secondary | ICD-10-CM | POA: Diagnosis not present

## 2021-12-02 DIAGNOSIS — Z23 Encounter for immunization: Secondary | ICD-10-CM

## 2021-12-02 MED ORDER — FLUCONAZOLE 150 MG PO TABS: 150.0000 mg | ORAL_TABLET | Freq: Once | ORAL | 0 refills | Status: AC

## 2021-12-02 MED ORDER — MEDROXYPROGESTERONE ACETATE 150 MG/ML IM SUSP
150.0000 mg | Freq: Once | INTRAMUSCULAR | Status: AC
  Administered 2021-12-02: 150 mg via INTRAMUSCULAR

## 2021-12-02 NOTE — Assessment & Plan Note (Signed)
Has been having itchy/white-colored discharge alongside greenish discharge. -Fluconazole x1 -GC -RPR -HIV -Trichomonas, candida, BV -Depo shot given today

## 2021-12-02 NOTE — Assessment & Plan Note (Addendum)
Patient has been feeling very tired with propranolol.  No true hypertension today however she feels like it has been helping her headaches. -Continue propanolol 80 mg at night only -Monitor at next visit

## 2021-12-02 NOTE — Progress Notes (Signed)
    SUBJECTIVE:   CHIEF COMPLAINT / HPI:   Hypertension Patient is a 41 y.o. female who present today for follow up of hypertension.   Patient endorses difficulties with medication compliance   Home medications include: propanolol 80 mg daily BID (she takes it at 0500 and 7:30/8 PM) Makes her sleepy in the morning and at night she has been waking up at 2 AM. Patient endorses taking these medications as prescribed. Headache has been improved from prior, denies any vision changes, shortness of breath, lower extremity swelling or chest pain   Most recent creatinine trend:  Lab Results  Component Value Date   CREATININE 0.93 06/07/2020   CREATININE 0.78 12/29/2011   CREATININE 0.91 02/23/2007   Patient does not check blood pressure at home.  Patient has not had a BMP in the past 1 year.  Vaginal Discharge 3-4 times a year she has had BV. Last visit she had trichomonas.  She was unable to complete her Flagyl course at that time due to GI intolerance. White chunky discharge and light green discharge.  Slight odor. Has been putting monistat to this area but has not helped.  Feels very itchy. Would like STD testing today.  Denies any new partners.  Uses Depo for contraception and would like to continue this today and is due for her shot today.  PERTINENT  PMH / PSH: migraine  OBJECTIVE:   BP 132/71   Pulse 87   Ht 5\' 3"  (1.6 m)   Wt 196 lb 3.2 oz (89 kg)   SpO2 100%   BMI 34.76 kg/m   General: NAD, pleasant, able to participate in exam Respiratory: Normal effort, no obvious respiratory distress Pelvic: VULVA: normal appearing vulva with no masses, tenderness or lesions, VAGINA: Normal appearing vagina with normal color, no lesions, with white, green, and thick discharge present, labial shallow excoriations present CERVIX: No lesions, copious, white, and green discharge present  [place] present for pelvic exam   ASSESSMENT/PLAN:   HTN  (hypertension) Patient has been feeling very tired with propranolol.  No true hypertension today however she feels like it has been helping her headaches. -Continue propanolol 80 mg at night only -Monitor at next visit  Vaginal discharge Has been having itchy/white-colored discharge alongside greenish discharge. -Fluconazole x1 -GC -RPR -HIV -Trichomonas, candida, BV -Depo shot given today   Gerrit Heck, MD [place]

## 2021-12-02 NOTE — Patient Instructions (Signed)
It was great to see you! Thank you for allowing me to participate in your care!   I recommend that you always bring your medications to each appointment as this makes it easy to ensure we are on the correct medications and helps [place] not miss when refills are needed.  Our plans for today:  - I have sent a 1 time dose of fluconazole - Please take propanolol 80 mg at night only and see if this helps  We are checking some labs today, I will call you if they are abnormal will send you a MyChart message or a letter if they are normal.  If you do not hear about your labs in the next 2 weeks please let us know.  Take care and seek immediate care sooner if you develop any concerns. Please remember to show up 15 minutes before your scheduled appointment time!  Gerrit Heck, MD [place]

## 2021-12-03 ENCOUNTER — Encounter: Payer: Self-pay | Admitting: Student

## 2021-12-03 LAB — RPR: RPR Ser Ql: NONREACTIVE

## 2021-12-03 LAB — HIV ANTIBODY (ROUTINE TESTING W REFLEX): HIV Screen 4th Generation wRfx: NONREACTIVE

## 2021-12-04 LAB — CERVICOVAGINAL ANCILLARY ONLY
Bacterial Vaginitis (gardnerella): NEGATIVE
Candida Glabrata: NEGATIVE
Candida Vaginitis: NEGATIVE
Chlamydia: NEGATIVE
Comment: NEGATIVE
Comment: NEGATIVE
Comment: NEGATIVE
Comment: NEGATIVE
Comment: NEGATIVE
Comment: NORMAL
Neisseria Gonorrhea: NEGATIVE
Trichomonas: NEGATIVE

## 2021-12-06 ENCOUNTER — Telehealth: Payer: Self-pay

## 2021-12-06 NOTE — Telephone Encounter (Signed)
Patient calls nurse line regarding results from visit on 10/16. Advised of negative results. Patient would like to speak with provider regarding continued symptoms. She is still having vaginal itching/irritation and green vaginal discharge.   Forwarding to Dr. Jinny Sanders.   Talbot Grumbling, RN

## 2022-01-27 ENCOUNTER — Encounter: Payer: Self-pay | Admitting: Family Medicine

## 2022-01-27 ENCOUNTER — Other Ambulatory Visit (HOSPITAL_COMMUNITY)
Admission: RE | Admit: 2022-01-27 | Discharge: 2022-01-27 | Disposition: A | Discharge status: Home / Self Care | Payer: Managed Care, Other (non HMO) | Source: Ambulatory Visit | Attending: Family Medicine | Admitting: Family Medicine

## 2022-01-27 ENCOUNTER — Ambulatory Visit (INDEPENDENT_AMBULATORY_CARE_PROVIDER_SITE_OTHER): Payer: Managed Care, Other (non HMO) | Admitting: Family Medicine

## 2022-01-27 VITALS — BP 138/95 | Wt 197.2 lb

## 2022-01-27 DIAGNOSIS — N898 Other specified noninflammatory disorders of vagina: Secondary | ICD-10-CM | POA: Diagnosis not present

## 2022-01-27 LAB — POCT WET PREP (WET MOUNT)
Clue Cells Wet Prep Whiff POC: NEGATIVE
Trichomonas Wet Prep HPF POC: ABSENT
WBC, Wet Prep HPF POC: >20

## 2022-01-27 LAB — POCT URINALYSIS DIP (MANUAL ENTRY)
Bilirubin, UA: NEGATIVE
Blood, UA: NEGATIVE
Glucose, UA: NEGATIVE mg/dL
Ketones, POC UA: NEGATIVE mg/dL
Nitrite, UA: NEGATIVE
Protein Ur, POC: NEGATIVE mg/dL
Spec Grav, UA: 1.02 (ref 1.010–1.025)
Urobilinogen, UA: 0.2 E.U./dL
pH, UA: 7 (ref 5.0–8.0)

## 2022-01-27 NOTE — Progress Notes (Signed)
    SUBJECTIVE:   CHIEF COMPLAINT / HPI:   Patient presents with vaginal discharge that started 1-2 months ago. Describes it as a green discharge. Also having mild dysuria during this time. Denies fever, chills, abdominal or pelvic pain or other symptoms. She has not been sexually active in at least 2 months since these symptoms started. She is on depo for contraception.   OBJECTIVE:   BP (!) 138/95   Wt 197 lb 4 oz (89.5 kg)   BMI 34.94 kg/m   General: Patient well-appearing, in no acute distress. Resp: normal work of breathing noted GU: no cervical or vaginal discharge noted, no associated odor noted, no external lesions or rashes noted  GU exam performed in the presence of chaperone, [employer], CMA.   ASSESSMENT/PLAN:   Vaginal discharge -pending wet prep and GC/Chlamydia testing -UA also pending -patient politely declined HIV and RPR testing -follow up as appropriate     Peachie Barkalow Robyne Peers, DO [facility]

## 2022-01-27 NOTE — Patient Instructions (Addendum)
It was great seeing you today!  Today we discussed your vaginal discharge. We did testing for this, I will let you know of any abnormal results.   Please follow up at your next scheduled appointment, if anything arises between now and then, please don't hesitate to contact our office.   Thank you for allowing [place] to be a part of your medical care!  Thank you, Dr. Robyne Peers  Also a reminder of our clinic's no-show policy. Please make sure to arrive at least 15 minutes prior to your scheduled appointment time. Please try to cancel before 24 hours if you are not able to make it. If you no-show for 2 appointments then you will be receiving a warning letter. If you no-show after 3 visits, then you may be at risk of being dismissed from our clinic. This is to ensure that everyone is able to be seen in a timely manner. Thank you, we appreciate your assistance with this!

## 2022-01-27 NOTE — Assessment & Plan Note (Signed)
-  pending wet prep and GC/Chlamydia testing -UA also pending -patient politely declined HIV and RPR testing -follow up as appropriate

## 2022-01-28 ENCOUNTER — Other Ambulatory Visit: Payer: Self-pay | Admitting: Family Medicine

## 2022-01-28 ENCOUNTER — Telehealth: Payer: Self-pay

## 2022-01-28 DIAGNOSIS — N898 Other specified noninflammatory disorders of vagina: Secondary | ICD-10-CM

## 2022-01-28 LAB — URINALYSIS, MICROSCOPIC ONLY
Bacteria, UA: NONE SEEN
Casts: NONE SEEN /lpf
RBC, Urine: NONE SEEN /hpf (ref 0–2)

## 2022-01-28 MED ORDER — TINIDAZOLE 500 MG PO TABS: 2.0000 g | ORAL_TABLET | Freq: Once | ORAL | 0 refills | Status: AC

## 2022-01-28 NOTE — Telephone Encounter (Signed)
Patient returns call to nurse line regarding results. Advised of message per Dr. Robyne Peers.   Patient reports that she always gets yeast infection when taking this medication. She is requesting prescription for 2 tablets diflucan to be sent to [facility] on [address].   Patient is also requesting to speak with provider regarding concerns for recurrent vaginal discharge even though tests are coming back negative.   Please return call to patient at [phone].  Veronda Prude, RN

## 2022-01-29 ENCOUNTER — Telehealth: Payer: Self-pay | Admitting: Family Medicine

## 2022-01-29 LAB — CERVICOVAGINAL ANCILLARY ONLY
Bacterial Vaginitis (gardnerella): POSITIVE — AB
Candida Glabrata: NEGATIVE
Candida Vaginitis: NEGATIVE
Chlamydia: NEGATIVE
Comment: NEGATIVE
Comment: NEGATIVE
Comment: NEGATIVE
Comment: NEGATIVE
Comment: NEGATIVE
Comment: NORMAL
Neisseria Gonorrhea: NEGATIVE
Trichomonas: NEGATIVE

## 2022-01-29 NOTE — Telephone Encounter (Signed)
After multiple attempts finally able to get in touch with patient and discussed results along with treatment. Patient agreeable to plan in place and discussed ways to prevent BV vs trichomonas. All questions answered and patient voiced understanding. She has already picked up her treatment and completed it today.

## 2022-03-13 ENCOUNTER — Ambulatory Visit (INDEPENDENT_AMBULATORY_CARE_PROVIDER_SITE_OTHER): Payer: 59

## 2022-03-13 DIAGNOSIS — Z3042 Encounter for surveillance of injectable contraceptive: Secondary | ICD-10-CM

## 2022-03-13 LAB — POCT URINE PREGNANCY: Preg Test, Ur: NEGATIVE

## 2022-03-13 NOTE — Progress Notes (Signed)
Patient here today for Depo Provera injection and is not within her dates. Urine pregnancy obtained and was negative.     Last contraceptive appt was 12/02/21  Depo given in [place] today.  Site unremarkable & patient tolerated injection.    Next injection due 05/30/22-06/13/22.  Reminder card given.    Talbot Grumbling, RN

## 2022-03-19 MED ORDER — MEDROXYPROGESTERONE ACETATE 150 MG/ML IM SUSP
150.0000 mg | Freq: Once | INTRAMUSCULAR | Status: AC
  Administered 2022-03-13: 150 mg via INTRAMUSCULAR

## 2022-06-02 ENCOUNTER — Ambulatory Visit (INDEPENDENT_AMBULATORY_CARE_PROVIDER_SITE_OTHER): Payer: 59

## 2022-06-02 DIAGNOSIS — Z308 Encounter for other contraceptive management: Secondary | ICD-10-CM | POA: Diagnosis not present

## 2022-06-02 MED ORDER — MEDROXYPROGESTERONE ACETATE 150 MG/ML IM SUSY
150.0000 mg | PREFILLED_SYRINGE | Freq: Once | INTRAMUSCULAR | Status: AC
  Administered 2022-06-02: 150 mg via INTRAMUSCULAR

## 2022-06-02 NOTE — Progress Notes (Signed)
Patient here today for Depo Provera injection and is within her dates.    Last contraceptive appt was 12/02/2021.  Depo given in [facility] today. Site unremarkable & patient tolerated injection.    Next injection due 08/18/2022-09/01/2022. Reminder card given.

## 2022-07-27 ENCOUNTER — Ambulatory Visit (HOSPITAL_COMMUNITY)
Admission: EM | Admit: 2022-07-27 | Discharge: 2022-07-27 | Disposition: A | Discharge status: Home / Self Care | Payer: 59 | Attending: Emergency Medicine | Admitting: Emergency Medicine

## 2022-07-27 ENCOUNTER — Encounter (HOSPITAL_COMMUNITY): Payer: Self-pay | Admitting: Emergency Medicine

## 2022-07-27 DIAGNOSIS — N760 Acute vaginitis: Secondary | ICD-10-CM | POA: Insufficient documentation

## 2022-07-27 LAB — POCT URINALYSIS DIP (MANUAL ENTRY)
Bilirubin, UA: NEGATIVE
Blood, UA: NEGATIVE
Glucose, UA: NEGATIVE mg/dL
Ketones, POC UA: NEGATIVE mg/dL
Nitrite, UA: NEGATIVE
Protein Ur, POC: NEGATIVE mg/dL
Spec Grav, UA: 1.025 (ref 1.010–1.025)
Urobilinogen, UA: 1 E.U./dL
pH, UA: 5.5 (ref 5.0–8.0)

## 2022-07-27 MED ORDER — METRONIDAZOLE 0.75 % VA GEL: 1.0000 | Freq: Every day | VAGINAL | 0 refills | Status: AC

## 2022-07-27 MED ORDER — FLUCONAZOLE 150 MG PO TABS: ORAL_TABLET | ORAL | 0 refills | Status: DC

## 2022-07-27 NOTE — ED Provider Notes (Signed)
[facility]    CSN: [id number] Arrival date & time: 07/27/22  1530      History   Chief Complaint Chief Complaint  Patient presents with   Vaginal Discharge   Skin Problem    HPI Julie Hawkins is a 42 y.o. female.   Patient presents to clinic for complaints of abnormal vaginal discharge with an odor for the past week.  Reports her vaginal discharge is green in color.  She did try an over-the-counter bacterial vaginosis treatment that she got from [facility], has a history of BV.  She thinks this made her discharge worse.  She also has been itching vaginally, and has a raw spot to her labia.  Reports her urine has been abnormally dark in color.  Denies dysuria, flank pain or fevers.  No new sexual partners.  Reports negative HIV and syphilis screening earlier this year.  Gets the Depo injection for hormonal birth control.  The history is provided by the patient and medical records.  Vaginal Discharge Associated symptoms: no dysuria and no fever     Past Medical History:  Diagnosis Date   Depression     Patient Active Problem List   Diagnosis Date Noted   Pap smear for cervical cancer screening 09/02/2021   Routine screening for STI (sexually transmitted infection) 09/02/2021   Migraines 09/02/2021   HTN (hypertension) 09/02/2021   Encounter for other contraceptive management 03/15/2020   Tobacco abuse counseling 12/29/2011   Menorrhagia 12/29/2011   Vaginal discharge 12/29/2011   Trichomonas vaginalis infection 01/21/2011   DEPRESSION 02/23/2007    Past Surgical History:  Procedure Laterality Date   ENDOMETRIAL ABLATION      OB History   No obstetric history on file.      Home Medications    Prior to Admission medications   Medication Sig Start Date End Date Taking? Authorizing Provider  fluconazole (DIFLUCAN) 150 MG tablet Take 1 tablet if vaginal itching develops, and another in 72 hours if it persists. 07/27/22  Yes Rinaldo Ratel, [place] N,  FNP  metroNIDAZOLE (METROGEL) 0.75 % vaginal gel Place 1 Applicatorful vaginally at bedtime for 5 days. 07/27/22 08/01/22 Yes Rinaldo Ratel, [place] N, FNP  acetaminophen (TYLENOL) 500 MG tablet Take 1,000 mg by mouth every 6 (six) hours as needed for headache (pain).    [provider]  Multiple Vitamin (MULTIVITAMIN WITH MINERALS) TABS tablet Take 1 tablet by mouth daily.    [provider]    Family History Family History  Problem Relation Age of Onset   Cancer Mother 52       breast cancer   Diabetes Maternal Grandmother    Diabetes Paternal Grandmother     Social History Social History   Tobacco Use   Smoking status: Former    Packs/day: 1    Types: Cigarettes    Quit date: 05/05/2020    Years since quitting: 2.2   Smokeless tobacco: Never   Tobacco comments:    smoke black n mild a pack every 2 days  Substance Use Topics   Alcohol use: No   Drug use: No     Allergies   Patient has no known allergies.   Review of Systems Review of Systems  Constitutional:  Negative for fever.  Genitourinary:  Positive for genital sores and vaginal discharge. Negative for dysuria, hematuria, vaginal bleeding and vaginal pain.     Physical Exam Triage Vital Signs ED Triage Vitals [07/27/22 1613]  Enc Vitals Group  BP 106/85     Pulse Rate 92     Resp 17     Temp 97.9 F (36.6 C)     Temp Source Oral     SpO2 96 %     Weight      Height      Head Circumference      Peak Flow      Pain Score      Pain Loc      Pain Edu?      Excl. in GC?    No data found.  Updated Vital Signs BP 106/85 (BP Location: Right Arm)   Pulse 92   Temp 97.9 F (36.6 C) (Oral)   Resp 17   SpO2 96%   Visual Acuity Right Eye Distance:   Left Eye Distance:   Bilateral Distance:    Right Eye Near:   Left Eye Near:    Bilateral Near:     Physical Exam Vitals and nursing note reviewed.  Constitutional:      Appearance: Normal appearance.  HENT:     Head:  Normocephalic and atraumatic.     Right Ear: External ear normal.     Left Ear: External ear normal.     Nose: Nose normal.     Mouth/Throat:     Mouth: Mucous membranes are moist.  Eyes:     Conjunctiva/sclera: Conjunctivae normal.  Cardiovascular:     Rate and Rhythm: Normal rate.  Pulmonary:     Effort: Pulmonary effort is normal. No respiratory distress.  Genitourinary:      Comments: Skin erosion to her right labia.  No vesicular lesions.  Without induration or streaking, low concern for infection. Musculoskeletal:        General: No swelling. Normal range of motion.  Skin:    General: Skin is warm and dry.  Neurological:     General: No focal deficit present.     Mental Status: She is alert and oriented to person, place, and time.  Psychiatric:        Mood and Affect: Mood normal.        Behavior: Behavior normal. Behavior is cooperative.      UC Treatments / Results  Labs (all labs ordered are listed, but only abnormal results are displayed) Labs Reviewed  POCT URINALYSIS DIP (MANUAL ENTRY) - Abnormal; Notable for the following components:      Result Value   Leukocytes, UA Small (1+) (*)    All other components within normal limits  CERVICOVAGINAL ANCILLARY ONLY    EKG   Radiology No results found.  Procedures Procedures (including critical care time)  Medications Ordered in UC Medications - No data to display  Initial Impression / Assessment and Plan / UC Course  I have reviewed the triage vital signs and the nursing notes.  Pertinent labs & imaging results that were available during my care of the patient were reviewed by me and considered in my medical decision making (see chart for details).  Vitals and triage reviewed, patient is hemodynamically stable.  Has a skin abrasion to her right labia, from presumed itching.  Urinalysis negative for infection.  No induration, fluctuance, lymph node involvement.  Cytology swab obtained, will cover for  bacterial vaginosis due to recurrent infections and yeast vaginitis due to yeast infections after antibiotic use.  Will contact patient if we need to modify treatment.  Return and follow-up precautions given, no questions at this time.      Final  Clinical Impressions(s) / UC Diagnoses   Final diagnoses:  Acute vaginitis     Discharge Instructions      Due to your recurrent history of BV, I am covering you with the metronidazole vaginal gel.  Please insert vaginally at bedtime for 5 days.  You can take the Diflucan if vaginal itching develops to help cover for yeast.  We will contact you if we need to add any additional treatment.  Please abstain from intercourse until all test results are received.  Please ensure you are drinking at least 64 ounces of water to help with your urine color.  Return to clinic for any new or concerning symptoms.      ED Prescriptions     Medication Sig Dispense Auth. Provider   metroNIDAZOLE (METROGEL) 0.75 % vaginal gel Place 1 Applicatorful vaginally at bedtime for 5 days. 50 g Daishia Fetterly, [place] N, [place]   fluconazole (DIFLUCAN) 150 MG tablet Take 1 tablet if vaginal itching develops, and another in 72 hours if it persists. 2 tablet Satoshi Kalas, [place] N, FNP      PDMP not reviewed this encounter.   Vincy Feliz, [place] N, [place] 07/27/22 [phone]

## 2022-07-27 NOTE — ED Triage Notes (Signed)
Pt reports that she had green vaginal discharge with odor for a week.   Reports skin on right upper thigh area is raw that started today.   Adds that her urine has been very dark in color.

## 2022-07-27 NOTE — Discharge Instructions (Addendum)
Due to your recurrent history of BV, I am covering you with the metronidazole vaginal gel.  Please insert vaginally at bedtime for 5 days.  You can take the Diflucan if vaginal itching develops to help cover for yeast.  We will contact you if we need to add any additional treatment.  Please abstain from intercourse until all test results are received.  Please ensure you are drinking at least 64 ounces of water to help with your urine color.  Return to clinic for any new or concerning symptoms.

## 2022-07-28 LAB — CERVICOVAGINAL ANCILLARY ONLY
Bacterial Vaginitis (gardnerella): POSITIVE — AB
Candida Glabrata: NEGATIVE
Candida Vaginitis: NEGATIVE
Chlamydia: NEGATIVE
Comment: NEGATIVE
Comment: NEGATIVE
Comment: NEGATIVE
Comment: NEGATIVE
Comment: NEGATIVE
Comment: NORMAL
Neisseria Gonorrhea: NEGATIVE
Trichomonas: NEGATIVE

## 2022-08-25 ENCOUNTER — Ambulatory Visit (INDEPENDENT_AMBULATORY_CARE_PROVIDER_SITE_OTHER): Payer: 59

## 2022-08-25 DIAGNOSIS — Z3042 Encounter for surveillance of injectable contraceptive: Secondary | ICD-10-CM

## 2022-08-25 MED ORDER — MEDROXYPROGESTERONE ACETATE 150 MG/ML IM SUSY
150.0000 mg | PREFILLED_SYRINGE | Freq: Once | INTRAMUSCULAR | Status: AC
  Administered 2022-08-25: 150 mg via INTRAMUSCULAR

## 2022-08-25 NOTE — Progress Notes (Signed)
Patient here today for Depo Provera injection and is within her dates.    Last contraceptive appt was 12/02/2021  Depo given in [facility] today.  Site unremarkable & patient tolerated injection.    Next injection due 11/10/22-11/24/22.  Reminder card given.    Veronda Prude, RN

## 2022-12-01 ENCOUNTER — Ambulatory Visit: Payer: 59

## 2022-12-01 DIAGNOSIS — Z308 Encounter for other contraceptive management: Secondary | ICD-10-CM

## 2022-12-01 LAB — POCT URINE PREGNANCY: Preg Test, Ur: NEGATIVE

## 2022-12-01 MED ORDER — MEDROXYPROGESTERONE ACETATE 150 MG/ML IM SUSP
150.0000 mg | Freq: Once | INTRAMUSCULAR | Status: AC
  Administered 2022-12-01: 150 mg via INTRAMUSCULAR

## 2022-12-01 NOTE — Progress Notes (Signed)
Patient here today for Depo Provera injection and is not within her dates. Pregnancy test obtained and negative. Patient counseled on using back up birth control for one week.   Last contraceptive appt was 12/02/2021.   Depo given in [facility] today. Site unremarkable & patient tolerated injection.     Next injection due 02/16/2023-03/02/2023.  Reminder card given.    Patient MUST have a PCP apt before next depo injection in nurse clinic.

## 2023-02-18 ENCOUNTER — Ambulatory Visit (HOSPITAL_COMMUNITY)
Admission: EM | Admit: 2023-02-18 | Discharge: 2023-02-18 | Disposition: A | Discharge status: Home / Self Care | Payer: 59 | Attending: Emergency Medicine | Admitting: Emergency Medicine

## 2023-02-18 ENCOUNTER — Encounter (HOSPITAL_COMMUNITY): Payer: Self-pay

## 2023-02-18 DIAGNOSIS — N898 Other specified noninflammatory disorders of vagina: Secondary | ICD-10-CM | POA: Diagnosis present

## 2023-02-18 DIAGNOSIS — N760 Acute vaginitis: Secondary | ICD-10-CM | POA: Diagnosis present

## 2023-02-18 MED ORDER — FLUCONAZOLE 150 MG PO TABS: ORAL_TABLET | ORAL | 0 refills | Status: DC

## 2023-02-18 MED ORDER — METRONIDAZOLE 500 MG PO TABS: 500.0000 mg | ORAL_TABLET | Freq: Two times a day (BID) | ORAL | 0 refills | Status: DC

## 2023-02-18 NOTE — Discharge Instructions (Addendum)
 Take the Flagyl  twice daily as prescribed with food. Continue with at least 64 ounces of water daily, OTC probiotic with lactobacillus (the active microorganism in the female vagina) and cleaning with a mild soap. Avoid baths and internal cleaning. You can try boric acid suppositories, these are OTC.   There are several contact you if anything additional results on your vaginal swab.  Return to clinic for any new or urgent symptoms.

## 2023-02-18 NOTE — ED Triage Notes (Signed)
 Pt states vaginal discharge for the past 2 weeks.  States it is white and greenish.

## 2023-02-18 NOTE — ED Provider Notes (Addendum)
 [facility]    CSN: [id number] Arrival date & time: 02/18/23  9060      History   Chief Complaint Chief Complaint  Patient presents with   Vaginal Discharge    HPI Julie Hawkins is a 43 y.o. female.   Patient presents to clinic for complaints of a change to her vaginal discharge that has been present for the past 2 weeks.  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  Since visit on 07/2022 patient has been increasing water intake, doing over-the-counter probiotics and using unscented soap to clean externally, this seems to have helped a lot with her recurrent BV.  She had a thin discharge 2 weeks ago and over the past week it has gotten more thick.  Denies any odor.  She does have some vaginal itching and irritation, similar to previous BV infections. No odor. Some dysuria, thinks this is from vaginal irritation.   The history is provided by the patient and medical records.  Vaginal Discharge   Past Medical History:  Diagnosis Date   Depression     Patient Active Problem List   Diagnosis Date Noted   Pap smear for cervical cancer screening 09/02/2021   Routine screening for STI (sexually transmitted infection) 09/02/2021   Migraines 09/02/2021   HTN (hypertension) 09/02/2021   Encounter for other contraceptive management 03/15/2020   Tobacco abuse counseling 12/29/2011   Menorrhagia 12/29/2011   Vaginal discharge 12/29/2011   Trichomonas vaginalis infection 01/21/2011   DEPRESSION 02/23/2007    Past Surgical History:  Procedure Laterality Date   ENDOMETRIAL ABLATION      OB History   No obstetric history on file.      Home Medications    Prior to Admission medications   Medication Sig Start Date End Date Taking? Authorizing Provider  fluconazole  (DIFLUCAN ) 150 MG tablet Take if vaginal itching develops after taking Flagyl . 02/18/23  Yes Shana Zavaleta  N, FNP  metroNIDAZOLE  (FLAGYL ) 500 MG tablet Take 1 tablet (500 mg  total) by mouth 2 (two) times daily. 02/18/23  Yes Azaryah Oleksy  N, FNP  acetaminophen  (TYLENOL ) 500 MG tablet Take 1,000 mg by mouth every 6 (six) hours as needed for headache (pain).    [provider]  Multiple Vitamin (MULTIVITAMIN WITH MINERALS) TABS tablet Take 1 tablet by mouth daily.    [provider]    Family History Family History  Problem Relation Age of Onset   Cancer Mother 82       breast cancer   Diabetes Maternal Grandmother    Diabetes Paternal Grandmother     Social History Social History   Tobacco Use   Smoking status: Former    Current packs/day: 0.00    Types: Cigarettes    Quit date: 05/05/2020    Years since quitting: 2.7   Smokeless tobacco: Never   Tobacco comments:    smoke black n mild a pack every 2 days  Substance Use Topics   Alcohol use: No   Drug use: No     Allergies   Patient has no known allergies.   Review of Systems Review of Systems  Per HPI   Physical Exam Triage Vital Signs ED Triage Vitals [02/18/23 0950]  Encounter Vitals Group     BP 116/76     Systolic BP Percentile      Diastolic BP Percentile      Pulse Rate 80     Resp 16     Temp (!) 97.4 F (36.3  C)     Temp Source Oral     SpO2 98 %     Weight      Height      Head Circumference      Peak Flow      Pain Score 0     Pain Loc      Pain Education      Exclude from Growth Chart    No data found.  Updated Vital Signs BP 116/76 (BP Location: Left Arm)   Pulse 80   Temp (!) 97.4 F (36.3 C) (Oral)   Resp 16   SpO2 98%   Visual Acuity Right Eye Distance:   Left Eye Distance:   Bilateral Distance:    Right Eye Near:   Left Eye Near:    Bilateral Near:     Physical Exam Vitals and nursing note reviewed.  Constitutional:      Appearance: Normal appearance.  HENT:     Head: Normocephalic and atraumatic.     Right Ear: External ear normal.     Left Ear: External ear normal.     Nose: Nose normal.     Mouth/Throat:      Mouth: Mucous membranes are moist.  Eyes:     Conjunctiva/sclera: Conjunctivae normal.  Cardiovascular:     Rate and Rhythm: Normal rate.  Pulmonary:     Effort: Pulmonary effort is normal. No respiratory distress.  Musculoskeletal:        General: Normal range of motion.  Skin:    General: Skin is warm and dry.  Neurological:     General: No focal deficit present.     Mental Status: She is alert and oriented to person, place, and time.  Psychiatric:        Mood and Affect: Mood normal.        Behavior: Behavior normal.      UC Treatments / Results  Labs (all labs ordered are listed, but only abnormal results are displayed) Labs Reviewed  CERVICOVAGINAL ANCILLARY ONLY    EKG   Radiology No results found.  Procedures Procedures (including critical care time)  Medications Ordered in UC Medications - No data to display  Initial Impression / Assessment and Plan / UC Course  I have reviewed the triage vital signs and the nursing notes.  Pertinent labs & imaging results that were available during my care of the patient were reviewed by me and considered in my medical decision making (see chart for details).  Vitals and triage reviewed, patient is hemodynamically stable.  Symptoms consistent with previous BV infections, will send in Flagyl  and obtain cytology.  Staff will contact if additional treatment is needed.  Symptomatic management/prevention discussed.  Patient has some external excoriation, advised Neosporin or over-the-counter Monistat and restrain from itching to help with this.  Plan of care, follow-up care return precautions given, no questions at this time.  Upon discharge patient requests diflucan  to cover for yeast if this develops d/t abx use, will send.      Final Clinical Impressions(s) / UC Diagnoses   Final diagnoses:  Vaginal discharge  Acute vaginitis     Discharge Instructions      Take the Flagyl  twice daily as prescribed with food.  Continue with at least 64 ounces of water daily, OTC probiotic with lactobacillus (the active microorganism in the female vagina) and cleaning with a mild soap. Avoid baths and internal cleaning. You can try boric acid suppositories, these are OTC.   There  are several contact you if anything additional results on your vaginal swab.  Return to clinic for any new or urgent symptoms.     ED Prescriptions     Medication Sig Dispense Auth. Provider   metroNIDAZOLE  (FLAGYL ) 500 MG tablet Take 1 tablet (500 mg total) by mouth 2 (two) times daily. 14 tablet Maxie Slovacek  N, FNP   fluconazole  (DIFLUCAN ) 150 MG tablet Take if vaginal itching develops after taking Flagyl . 1 tablet Dreama, Malosi Hemstreet  N, FNP      PDMP not reviewed this encounter.   Dreama Graylon SAILOR, FNP 02/18/23 1013    Dreama, Hodaya Curto  N, FNP 02/18/23 1018

## 2023-02-19 LAB — CERVICOVAGINAL ANCILLARY ONLY
Bacterial Vaginitis (gardnerella): NEGATIVE
Candida Glabrata: NEGATIVE
Candida Vaginitis: POSITIVE — AB
Chlamydia: NEGATIVE
Comment: NEGATIVE
Comment: NEGATIVE
Comment: NEGATIVE
Comment: NEGATIVE
Comment: NEGATIVE
Comment: NORMAL
Neisseria Gonorrhea: NEGATIVE
Trichomonas: NEGATIVE

## 2023-02-20 ENCOUNTER — Encounter: Payer: Self-pay | Admitting: Emergency Medicine

## 2023-02-20 ENCOUNTER — Encounter (HOSPITAL_COMMUNITY): Payer: Self-pay

## 2023-02-20 ENCOUNTER — Ambulatory Visit (HOSPITAL_COMMUNITY)
Admission: EM | Admit: 2023-02-20 | Discharge: 2023-02-20 | Disposition: A | Discharge status: Home / Self Care | Payer: 59 | Attending: Family Medicine | Admitting: Family Medicine

## 2023-02-20 DIAGNOSIS — N760 Acute vaginitis: Secondary | ICD-10-CM | POA: Diagnosis not present

## 2023-02-20 DIAGNOSIS — R102 Pelvic and perineal pain: Secondary | ICD-10-CM | POA: Insufficient documentation

## 2023-02-20 MED ORDER — DOXYCYCLINE HYCLATE 100 MG PO CAPS: 100.0000 mg | ORAL_CAPSULE | Freq: Two times a day (BID) | ORAL | 0 refills | Status: AC

## 2023-02-20 MED ORDER — FLUCONAZOLE 150 MG PO TABS
150.0000 mg | ORAL_TABLET | ORAL | 0 refills | Status: AC
Start: 2023-02-20 — End: 2023-02-27

## 2023-02-20 NOTE — ED Triage Notes (Signed)
 Pt states vaginal discharge for the past 2 weeks.  Seen here for same 2 days ago.  States that her symptoms have gotten worse.

## 2023-02-20 NOTE — Discharge Instructions (Signed)
 Staff will notify you if there is anything positive on the swab.  Take doxycycline 100 mg --1 capsule 2 times daily for 7 days  Take fluconazole 150 mg--1 tablet every 3 days for 3 doses

## 2023-02-20 NOTE — ED Provider Notes (Signed)
 [facility]    CSN: [id number] Arrival date & time: 02/20/23  1739      History   Chief Complaint Chief Complaint  Patient presents with   Vaginal Discharge    HPI Julie Hawkins is a 43 y.o. female.    Vaginal Discharge Here with worsening perineal pain and feeling swollen there.  She was seen here January 1 and vaginal swab was done then.  It was positive only for yeast.  Diflucan  and Flagyl  were sent in empirically from the visit on the first and she began taking those yesterday on January 2.  She now feels she is worsened.  No fever  She has had an endometrial ablation and only has periods irregularly.  She is on the Depo-Provera  injection for contraception  NKDA  She is having a little bit of pelvic pain.  Past Medical History:  Diagnosis Date   Depression     Patient Active Problem List   Diagnosis Date Noted   Pap smear for cervical cancer screening 09/02/2021   Routine screening for STI (sexually transmitted infection) 09/02/2021   Migraines 09/02/2021   HTN (hypertension) 09/02/2021   Encounter for other contraceptive management 03/15/2020   Tobacco abuse counseling 12/29/2011   Menorrhagia 12/29/2011   Vaginal discharge 12/29/2011   Trichomonas vaginalis infection 01/21/2011   DEPRESSION 02/23/2007    Past Surgical History:  Procedure Laterality Date   ENDOMETRIAL ABLATION      OB History   No obstetric history on file.      Home Medications    Prior to Admission medications   Medication Sig Start Date End Date Taking? Authorizing Provider  doxycycline  (VIBRAMYCIN ) 100 MG capsule Take 1 capsule (100 mg total) by mouth 2 (two) times daily for 7 days. 02/20/23 02/27/23 Yes Tiffiny Worthy, Sharlet POUR, MD  fluconazole  (DIFLUCAN ) 150 MG tablet Take 1 tablet (150 mg total) by mouth every 3 (three) days for 3 doses. 02/20/23 02/27/23 Yes Vonna Sharlet POUR, MD  acetaminophen  (TYLENOL ) 500 MG tablet Take 1,000 mg by mouth every 6 (six) hours as  needed for headache (pain).    [provider]  Multiple Vitamin (MULTIVITAMIN WITH MINERALS) TABS tablet Take 1 tablet by mouth daily.    [provider]    Family History Family History  Problem Relation Age of Onset   Cancer Mother 8       breast cancer   Diabetes Maternal Grandmother    Diabetes Paternal Grandmother     Social History Social History   Tobacco Use   Smoking status: Former    Current packs/day: 0.00    Types: Cigarettes    Quit date: 05/05/2020    Years since quitting: 2.7   Smokeless tobacco: Never   Tobacco comments:    smoke black n mild a pack every 2 days  Substance Use Topics   Alcohol use: No   Drug use: No     Allergies   Patient has no known allergies.   Review of Systems Review of Systems  Genitourinary:  Positive for vaginal discharge.     Physical Exam Triage Vital Signs ED Triage Vitals  Encounter Vitals Group     BP 02/20/23 1907 (!) 142/100     Systolic BP Percentile --      Diastolic BP Percentile --      Pulse Rate 02/20/23 1907 63     Resp 02/20/23 1907 16     Temp 02/20/23 1907 98 F (36.7 C)  Temp Source 02/20/23 1907 Oral     SpO2 02/20/23 1907 99 %     Weight --      Height --      Head Circumference --      Peak Flow --      Pain Score 02/20/23 1909 0     Pain Loc --      Pain Education --      Exclude from Growth Chart --    No data found.  Updated Vital Signs BP (!) 142/100 (BP Location: Left Arm)   Pulse 63   Temp 98 F (36.7 C) (Oral)   Resp 16   SpO2 99%   Visual Acuity Right Eye Distance:   Left Eye Distance:   Bilateral Distance:    Right Eye Near:   Left Eye Near:    Bilateral Near:     Physical Exam Vitals reviewed.  Constitutional:      General: She is not in acute distress.    Appearance: She is not ill-appearing, toxic-appearing or diaphoretic.  Genitourinary:    Comments: External exam of the perineum is normal.  There is some yellowish or greenish  discharge adherent to her vaginal mucosa at the introitus.  I do not see any vesicular or ulcerative lesions.  There is no skin induration or erythema that would be consistent with cellulitis.  Repeat vaginal swab for cytology was done tonight.  Chaperone is present during the time of exam. Skin:    Coloration: Skin is not pale.  Neurological:     Mental Status: She is alert and oriented to person, place, and time.  Psychiatric:        Behavior: Behavior normal.      UC Treatments / Results  Labs (all labs ordered are listed, but only abnormal results are displayed) Labs Reviewed  CERVICOVAGINAL ANCILLARY ONLY    EKG   Radiology No results found.  Procedures Procedures (including critical care time)  Medications Ordered in UC Medications - No data to display  Initial Impression / Assessment and Plan / UC Course  I have reviewed the triage vital signs and the nursing notes.  Pertinent labs & imaging results that were available during my care of the patient were reviewed by me and considered in my medical decision making (see chart for details).     Vaginal swab is done at the time of exam, and we will notify of any positives on that and treat per protocol.  Since she is having some pelvic pain and the discharge is not typical of white Candida discharge, doxycycline  is sent in for possible treatment of chlamydia.  More fluconazole  is sent in so that she can take it every 3 days and take 1 dose after she is finished the doxycycline   She will stop taking the metronidazole  for now since her BV was negative on the prior swab Final Clinical Impressions(s) / UC Diagnoses   Final diagnoses:  Acute vaginitis  Pelvic pain     Discharge Instructions      Staff will notify you if there is anything positive on the swab.  Take doxycycline  100 mg --1 capsule 2 times daily for 7 days  Take fluconazole  150 mg--1 tablet every 3 days for 3 doses     ED Prescriptions      Medication Sig Dispense Auth. Provider   doxycycline  (VIBRAMYCIN ) 100 MG capsule Take 1 capsule (100 mg total) by mouth 2 (two) times daily for 7 days. 14 capsule Terilyn Sano,  Fortune Brannigan K, MD   fluconazole  (DIFLUCAN ) 150 MG tablet Take 1 tablet (150 mg total) by mouth every 3 (three) days for 3 doses. 3 tablet Arryn Terrones, Sharlet POUR, MD      PDMP not reviewed this encounter.   Vonna Sharlet POUR, MD 02/20/23 2036

## 2023-02-23 LAB — CERVICOVAGINAL ANCILLARY ONLY
Bacterial Vaginitis (gardnerella): POSITIVE — AB
Candida Glabrata: NEGATIVE
Candida Vaginitis: POSITIVE — AB
Chlamydia: NEGATIVE
Comment: NEGATIVE
Comment: NEGATIVE
Comment: NEGATIVE
Comment: NEGATIVE
Comment: NEGATIVE
Comment: NORMAL
Neisseria Gonorrhea: NEGATIVE
Trichomonas: NEGATIVE

## 2023-02-25 MED ORDER — METRONIDAZOLE 500 MG PO TABS: 500.0000 mg | ORAL_TABLET | Freq: Two times a day (BID) | ORAL | 0 refills | Status: DC

## 2023-03-09 ENCOUNTER — Encounter: Payer: Self-pay | Admitting: Family Medicine

## 2023-03-09 ENCOUNTER — Ambulatory Visit: Payer: 59 | Admitting: Family Medicine

## 2023-03-09 VITALS — BP 130/88 | HR 74 | Ht 63.0 in | Wt 192.0 lb

## 2023-03-09 DIAGNOSIS — R7303 Prediabetes: Secondary | ICD-10-CM

## 2023-03-09 DIAGNOSIS — Z0000 Encounter for general adult medical examination without abnormal findings: Secondary | ICD-10-CM | POA: Diagnosis not present

## 2023-03-09 DIAGNOSIS — Z308 Encounter for other contraceptive management: Secondary | ICD-10-CM | POA: Diagnosis not present

## 2023-03-09 DIAGNOSIS — F411 Generalized anxiety disorder: Secondary | ICD-10-CM | POA: Diagnosis not present

## 2023-03-09 DIAGNOSIS — Z1231 Encounter for screening mammogram for malignant neoplasm of breast: Secondary | ICD-10-CM

## 2023-03-09 LAB — POCT GLYCOSYLATED HEMOGLOBIN (HGB A1C): Hemoglobin A1C: 5.6 % (ref 4.0–5.6)

## 2023-03-09 LAB — POCT URINE PREGNANCY: Preg Test, Ur: NEGATIVE

## 2023-03-09 MED ORDER — ESCITALOPRAM OXALATE 10 MG PO TABS: 10.0000 mg | ORAL_TABLET | Freq: Every day | ORAL | 1 refills | Status: AC

## 2023-03-09 MED ORDER — HYDROXYZINE HCL 10 MG PO TABS: 10.0000 mg | ORAL_TABLET | Freq: Every evening | ORAL | 1 refills | Status: DC | PRN

## 2023-03-09 MED ORDER — MEDROXYPROGESTERONE ACETATE 150 MG/ML IM SUSY
150.0000 mg | PREFILLED_SYRINGE | Freq: Once | INTRAMUSCULAR | Status: AC
  Administered 2023-03-09: 150 mg via INTRAMUSCULAR

## 2023-03-09 NOTE — Progress Notes (Signed)
    SUBJECTIVE:   Chief compliant/HPI: annual examination  Julie Hawkins is a 43 y.o. who presents today for an annual exam.   Elevated blood pressure reading - Medications: None - Checking BP at home: One high reading at [facility] but otherwise has been okay - Denies any SOB, CP, vision changes, LE edema, medication SEs, or symptoms of hypotension Drinking 2 energy drinks per day which may be impacting blood pressure.  States she does not sleep well at night and therefore needs energy drinks to stay awake.  Anxiety Mother diagnosed with breast cancer recurrence, initially diagnosed 8 years ago in 70s. Feels like her "mind is everywhere". H/o depression and anxiety in the past but not currently on treatment. Affecting her ADLs. Drives during the day so does not want to be sleepy.  Think she previously tried sertraline which gave her some diarrhea.  Migraine - twice per month. Triggered by heat. Manged with Excedrin  History tabs reviewed and updated.   OBJECTIVE:   BP 130/88   Pulse 74   Ht 5\' 3"  (1.6 m)   Wt 192 lb (87.1 kg)   BMI 34.01 kg/m    General: Well-appearing. Alert. NAD HEENT: Normocephalic. White sclera. No rhinorrhea or congestion.  Moist mucous membranes.  No thyromegaly or cervical lymphadenopathy. CV: RRR without murmur Pulm: CTAB. Normal WOB on RA. No wheezing Abdomen: Soft, non-tender, non-distended. +BS Ext: Well perfused. Cap refill < 3 seconds Skin: Warm, dry. No rashes noted   ASSESSMENT/PLAN:    Assessment & Plan Encounter for other contraceptive management Due for Depo, just outside the window.  Urine pregnancy negative today, recently sure not pregnant.  Administered and due for repeat in 4/7-4/21. GAD (generalized anxiety disorder) PHQ: 10, denies SI.  Anxiety and depression limiting ADLs, interested in counseling and medication management. -Provided list of therapy resources -Start escitalopram 10 mg daily -Start hydroxyzine 10 mg as needed  nightly -F/u in 6 to 8 weeks to discuss mood Prediabetes Strong family history of DM, prior history of prediabetes. -A1c and lipid panel today   Annual Examination  See AVS for age appropriate recommendations.   PHQ score 10, reviewed and discussed.  Blood pressure reviewed and at goal.  The patient currently uses Depo for contraception. Folate recommended as appropriate, minimum of 400 mcg per day.   Considered the following items based upon USPSTF recommendations: Diabetes screening: ordered Screening for elevated cholesterol: ordered HIV testing: Declined Hepatitis C: Declined Hepatitis B: Declined Syphilis if at high risk: Declined GC/CT: Declined Reviewed risk factors for latent tuberculosis and not indicated Reviewed risk factors for osteoporosis. Using FRAX tool estimated risk of major osteoporotic fracture of  1.5%, early screening not ordered   Discussed family history, BRCA testing not indicated. Cervical cancer screening: prior Pap reviewed, repeat due in 2026 Breast cancer screening: discussed potential benefits, risks including overdiagnosis and biopsy, elected proceed with mammogram. Provided information for the breast center. Colorectal cancer screening: not applicable given age.  if age 16 or over.   Follow up in 1 month for discussion of anxiety   Elberta Fortis, MD [facility]

## 2023-03-09 NOTE — Patient Instructions (Addendum)
It was wonderful to see you today! Thank you for choosing [facility].   Please bring ALL of your medications with you to every visit.   Today we talked about:  Please focus on healthy lifestyle including limit the amount of soda/juice and processed food.  I recommend focusing on a balanced diet with lean proteins, whole grains and lots of fruits and vegetables.  Recommendation is to get 150 minutes of exercise per week whether via walking or other exercise. You can schedule with our pharmacist to have 24 hour blood pressure monitoring if you have concerns about your blood pressure but it is normal today. For anxiety please start taking the Escitalopram 10 mg daily.  Please keep in mind it can take 4 to 6 weeks for this medication to take effect.  In the beginning it may cause some mild stomach upset but this usually passes in the first week or 2.  Please also keep in mind it can decrease libido, if this is an ongoing issue please let us know.  You can use the hydroxyzine as needed before bed to help you sleep.  Please keep in mind it can make you drowsy so I do not recommend taking it during the day when you are driving. You received Depo today. You next shot will be due 4/7 to 4/21. Please return during that time.  I ordered your mammogram today, you can obtain at the breast center. There contact information is:  [address] [place],  [place]  [address] [phone]  Please follow up in 6-8 weeks to discuss anxiety  If you haven't already, sign up for My Chart to have easy access to your labs results, and communication with your primary care physician.   We are checking some labs today. If they are abnormal, I will call you. If they are normal, I will send you a MyChart message (if it is active) or a letter in the mail. If you do not hear about your labs in the next 2 weeks, please call the office.  Call the clinic at [phone] if your symptoms worsen or you have any  concerns.  Please be sure to schedule follow up at the front desk before you leave today.   Elberta Fortis, DO Family Medicine     Therapy and Counseling Resources Most providers on this list will take Medicaid. Patients with commercial insurance or Medicare should contact their insurance company to get a list of in network providers.  [facility] (takes children) Location 1: [address] [place], [place] [address] Location 2: [address] [place], [place] [address] [phone]   [facility] (spanish speaking therapist available)(habla espanol)(take medicare and medicaid)  [address], [place], [place] [address], [place] [email or web address] [phone]  [facility]iat[facility] Counseling [address] [place], [place] [address] [phone]  [facility]   [address], Suite [place], [place] [address]      [phone]  [facility] (spanish available) [address]  [place], [place] [address] [phone]  [facility] (take [facility] and medicare) [address], [address]  [place], [place] [address]       [phone]     [facility] (virtual only) [phone]  [facility]ov[facility]uss[facility] [address] [place], [place] [phone]  [facility]:  [address] [place] [phone]  [facility]:  [address] [place], [place] [phone]  [facility] (under & uninsured) [address]   [place] [phone]    [email or web address]    [facility] [address]  [place]    [phone]  [facility] -[address]     Phone:  [phone]     [facility]-  [address]  [phone]   [facility] [address]      [place], [place] [phone]  [facility]: [address]  [place], [place], [place]  [phone]   [facility] (Spanish therapist) [email or web address]  [address] [place]  [address]   [place] [address]    [phone]    [facility]   [address],  [place], [place]  [phone]   [facility]  [address] [place]  [phone]  [facility]  [address] [place], [place]        [phone]  Open Access/Walk In Clinic under & uninsured  [facility]  [address] [place], [place] [phone] Crisis [phone]  [facility] [place],  (Spanish)   [address], [place]: [phone]) 8:30 - 12; 1 - 2:30  [facility] [employer],  [address] [place], [place]    [phone]):8:30 - 12; 2 - 3PM  [facility] [employer],  [address]  [place]; [phone]):   Mon - Fri 8 AM - 5 PM  [facility] [address] [place]  MWF 12:30 to 3:00 or call to schedule an appointment  [phone]  Specific Provider options Psychology Today  [email or web address] click on find a therapist  enter your zip code left side and select or tailor a therapist for your specific need.   [facility] Provider Directory [email or web address]  (Medicaid)   Follow all drop down to find a provider  Social Support program [facility] [place] [phone] or [employer] [address], [place], [place] Recovery support and educational   24- Hour Availability:   [facility]  [address] [place], [place] [phone] Crisis [phone]  [facility] [phone]  [place] [facility]  [phone]   [facility]  [phone] (after hours)  Therapeutic Alternative/Mobile Crisis   [phone]  [place] National Suicide Hotline   [phone] Len Childs)  Call 911 or go to emergency room  [facility]  [phone]);  [place] and [employer]  [phone]); [place], [place], [place], [place], [place], [place], [place]

## 2023-03-09 NOTE — Assessment & Plan Note (Signed)
Due for Depo, just outside the window.  Urine pregnancy negative today, recently sure not pregnant.  Administered and due for repeat in 4/7-4/21.

## 2023-03-10 ENCOUNTER — Encounter: Payer: Self-pay | Admitting: Family Medicine

## 2023-03-10 LAB — LIPID PANEL
Chol/HDL Ratio: 3 {ratio} (ref 0.0–4.4)
Cholesterol, Total: 140 mg/dL (ref 100–199)
HDL: 46 mg/dL (ref >39)
LDL Chol Calc (NIH): 75 mg/dL (ref 0–99)
Triglycerides: 106 mg/dL (ref 0–149)
VLDL Cholesterol Cal: 19 mg/dL (ref 5–40)

## 2023-03-13 ENCOUNTER — Ambulatory Visit (HOSPITAL_COMMUNITY)
Admission: EM | Admit: 2023-03-13 | Discharge: 2023-03-13 | Disposition: A | Discharge status: Home / Self Care | Payer: 59 | Attending: Emergency Medicine | Admitting: Emergency Medicine

## 2023-03-13 ENCOUNTER — Encounter (HOSPITAL_COMMUNITY): Payer: Self-pay | Admitting: Emergency Medicine

## 2023-03-13 DIAGNOSIS — N760 Acute vaginitis: Secondary | ICD-10-CM | POA: Insufficient documentation

## 2023-03-13 LAB — POCT URINALYSIS DIP (MANUAL ENTRY)
Bilirubin, UA: NEGATIVE
Blood, UA: NEGATIVE
Glucose, UA: NEGATIVE mg/dL
Ketones, POC UA: NEGATIVE mg/dL
Nitrite, UA: NEGATIVE
Protein Ur, POC: NEGATIVE mg/dL
Spec Grav, UA: 1.025 (ref 1.010–1.025)
Urobilinogen, UA: 2 U/dL — AB
pH, UA: 6 (ref 5.0–8.0)

## 2023-03-13 MED ORDER — CLOTRIMAZOLE 1 % VA CREA: 1.0000 | TOPICAL_CREAM | Freq: Every day | VAGINAL | 0 refills | Status: AC

## 2023-03-13 NOTE — ED Triage Notes (Signed)
Patient presents with vaginal discharge, vaginal itching, frequency and dysuria x 2 days. Patient stated she was treated for BV and completed the course of antibiotics.

## 2023-03-13 NOTE — ED Provider Notes (Signed)
[facility]    CSN: [id number] Arrival date & time: 03/13/23  4098      History   Chief Complaint Chief Complaint  Patient presents with   Vaginal Itching   Vaginal Discharge   Urinary Frequency    HPI Julie Hawkins is a 43 y.o. female.   Patient presents to clinic for complaints of vaginal itching and a burning sensation with urination.  Presents with a picture of her vagina and the area is raw.  Reports she thinks it is overall because of how hard she has been scratching.  She took a Diflucan with the last dose of her Flagyl and doxycycline, does not think it was strong enough.   Patient was seen on 1/3 where her vaginal swab came back for bacterial vaginosis and yeast vaginitis.  Due to her discharge on physical exam she was placed on doxycycline for empiric treatment of chlamydia.  Depo injection given at PCP office visit on 1/20 after negative urine pregnancy.     The history is provided by the patient and medical records.  Vaginal Itching  Vaginal Discharge Associated symptoms: vaginal itching   Urinary Frequency    Past Medical History:  Diagnosis Date   Anxiety    Depression    Migraine     Patient Active Problem List   Diagnosis Date Noted   Pap smear for cervical cancer screening 09/02/2021   Routine screening for STI (sexually transmitted infection) 09/02/2021   Migraines 09/02/2021   HTN (hypertension) 09/02/2021   Encounter for other contraceptive management 03/15/2020   Tobacco abuse counseling 12/29/2011   Menorrhagia 12/29/2011   Vaginal discharge 12/29/2011   Trichomonas vaginalis infection 01/21/2011   DEPRESSION 02/23/2007    Past Surgical History:  Procedure Laterality Date   ENDOMETRIAL ABLATION      OB History   No obstetric history on file.      Home Medications    Prior to Admission medications   Medication Sig Start Date End Date Taking? Authorizing Provider  clotrimazole (GYNE-LOTRIMIN) 1 % vaginal  cream Place 1 Applicatorful vaginally at bedtime for 7 days. 03/13/23 03/20/23 Yes Rinaldo Ratel, [place] N, FNP  acetaminophen (TYLENOL) 500 MG tablet Take 1,000 mg by mouth every 6 (six) hours as needed for headache (pain).    [provider]  escitalopram (LEXAPRO) 10 MG tablet Take 1 tablet (10 mg total) by mouth daily. 03/09/23   Elberta Fortis, MD  hydrOXYzine (ATARAX) 10 MG tablet Take 1 tablet (10 mg total) by mouth at bedtime as needed. 03/09/23   Elberta Fortis, MD  Multiple Vitamin (MULTIVITAMIN WITH MINERALS) TABS tablet Take 1 tablet by mouth daily.    [provider]    Family History Family History  Problem Relation Age of Onset   Cancer Mother 53 - 74       breast cancer   Diabetes Maternal Grandmother    Diabetes Paternal Grandmother     Social History Social History   Tobacco Use   Smoking status: Former    Current packs/day: 0.00    Types: Cigarettes    Quit date: 05/05/2020    Years since quitting: 2.8   Smokeless tobacco: Never  Substance Use Topics   Alcohol use: Yes    Comment: Rare   Drug use: No     Allergies   Patient has no known allergies.   Review of Systems Review of Systems  Per HPI   Physical Exam Triage Vital Signs ED Triage Vitals  Encounter Vitals Group     BP 03/13/23 0849 109/78     Systolic BP Percentile --      Diastolic BP Percentile --      Pulse Rate 03/13/23 0849 90     Resp 03/13/23 0849 18     Temp 03/13/23 0849 97.7 F (36.5 C)     Temp Source 03/13/23 0849 Oral     SpO2 03/13/23 0849 97 %     Weight --      Height --      Head Circumference --      Peak Flow --      Pain Score 03/13/23 0847 0     Pain Loc --      Pain Education --      Exclude from Growth Chart --    No data found.  Updated Vital Signs BP 109/78 (BP Location: Right Arm)   Pulse 90   Temp 97.7 F (36.5 C) (Oral)   Resp 18   SpO2 97%   Visual Acuity Right Eye Distance:   Left Eye Distance:   Bilateral Distance:     Right Eye Near:   Left Eye Near:    Bilateral Near:     Physical Exam Vitals and nursing note reviewed.  Constitutional:      Appearance: Normal appearance.  HENT:     Head: Normocephalic and atraumatic.     Right Ear: External ear normal.     Left Ear: External ear normal.     Nose: Nose normal.     Mouth/Throat:     Mouth: Mucous membranes are moist.  Eyes:     Conjunctiva/sclera: Conjunctivae normal.  Cardiovascular:     Rate and Rhythm: Normal rate.  Pulmonary:     Effort: Pulmonary effort is normal. No respiratory distress.  Musculoskeletal:        General: Normal range of motion.  Skin:    General: Skin is warm and dry.  Neurological:     General: No focal deficit present.     Mental Status: She is alert and oriented to person, place, and time.  Psychiatric:        Mood and Affect: Mood normal.        Behavior: Behavior normal.      UC Treatments / Results  Labs (all labs ordered are listed, but only abnormal results are displayed) Labs Reviewed  POCT URINALYSIS DIP (MANUAL ENTRY) - Abnormal; Notable for the following components:      Result Value   Urobilinogen, UA 2.0 (*)    Leukocytes, UA Small (1+) (*)    All other components within normal limits  CERVICOVAGINAL ANCILLARY ONLY    EKG   Radiology No results found.  Procedures Procedures (including critical care time)  Medications Ordered in UC Medications - No data to display  Initial Impression / Assessment and Plan / UC Course  I have reviewed the triage vital signs and the nursing notes.  Pertinent labs & imaging results that were available during my care of the patient were reviewed by me and considered in my medical decision making (see chart for details).  Vitals and triage reviewed, patient is hemodynamically stable.  Photograph provided shows labia minora excoriation and irritation.  Will cover empirically with clotrimazole for yeast vaginitis due to high suspicions with dual  antibiotic therapy.  Urinalysis does not show evidence of urinary tract infection.  Symptomatic management reviewed.  Encourage PCP/OB/GYN follow-up for further investigation into recurrent vaginal infections.  Plan of care, follow-up care return precautions given, no questions at this time.    Final Clinical Impressions(s) / UC Diagnoses   Final diagnoses:  Acute vaginitis     Discharge Instructions      You can use over-the-counter Monistat externally to help with any vaginal itching.  Use the vaginal inserts nightly for the next 7 nights to treat your vaginal yeast infection.  The cytology swab will test for vaginal infections and we will contact you if treatment modification is needed.  Refrain from itching your vagina as this can cause skin breakdown, infection and prolong healing times. Since this is a reoccurring issue I suggest following up with your primary care provider or an OB/GYN for further evaluation.  Return to clinic for any new or urgent symptoms.      ED Prescriptions     Medication Sig Dispense Auth. Provider   clotrimazole (GYNE-LOTRIMIN) 1 % vaginal cream Place 1 Applicatorful vaginally at bedtime for 7 days. 45 g Sharonann Malbrough, [place] N, [place]      PDMP not reviewed this encounter.   Rinaldo Ratel [place] N, [place] 03/13/23 [phone]

## 2023-03-13 NOTE — Discharge Instructions (Addendum)
You can use over-the-counter Monistat externally to help with any vaginal itching.  Use the vaginal inserts nightly for the next 7 nights to treat your vaginal yeast infection.  The cytology swab will test for vaginal infections and we will contact you if treatment modification is needed.  Refrain from itching your vagina as this can cause skin breakdown, infection and prolong healing times. Since this is a reoccurring issue I suggest following up with your primary care provider or an OB/GYN for further evaluation.  Return to clinic for any new or urgent symptoms.

## 2023-03-15 LAB — CERVICOVAGINAL ANCILLARY ONLY
Bacterial Vaginitis (gardnerella): NEGATIVE
Candida Glabrata: NEGATIVE
Candida Vaginitis: NEGATIVE
Chlamydia: NEGATIVE
Comment: NEGATIVE
Comment: NEGATIVE
Comment: NEGATIVE
Comment: NEGATIVE
Comment: NEGATIVE
Comment: NORMAL
Neisseria Gonorrhea: NEGATIVE
Trichomonas: NEGATIVE

## 2023-04-06 ENCOUNTER — Other Ambulatory Visit: Payer: Self-pay | Admitting: Family Medicine

## 2023-04-06 DIAGNOSIS — F411 Generalized anxiety disorder: Secondary | ICD-10-CM

## 2023-05-25 ENCOUNTER — Ambulatory Visit: Admitting: Student

## 2023-05-25 NOTE — Progress Notes (Deleted)

## 2023-06-16 ENCOUNTER — Ambulatory Visit: Admitting: Family Medicine

## 2023-06-16 ENCOUNTER — Encounter: Payer: Self-pay | Admitting: Family Medicine

## 2023-06-16 VITALS — BP 123/90 | HR 79 | Ht 63.0 in | Wt 193.6 lb

## 2023-06-16 DIAGNOSIS — F411 Generalized anxiety disorder: Secondary | ICD-10-CM

## 2023-06-16 DIAGNOSIS — Z1231 Encounter for screening mammogram for malignant neoplasm of breast: Secondary | ICD-10-CM | POA: Diagnosis not present

## 2023-06-16 DIAGNOSIS — Z309 Encounter for contraceptive management, unspecified: Secondary | ICD-10-CM

## 2023-06-16 DIAGNOSIS — R030 Elevated blood-pressure reading, without diagnosis of hypertension: Secondary | ICD-10-CM

## 2023-06-16 LAB — POCT URINE PREGNANCY: Preg Test, Ur: NEGATIVE

## 2023-06-16 MED ORDER — MEDROXYPROGESTERONE ACETATE 150 MG/ML IM SUSP
150.0000 mg | Freq: Once | INTRAMUSCULAR | Status: AC
  Administered 2023-06-16: 150 mg via INTRAMUSCULAR

## 2023-06-16 MED ORDER — HYDROXYZINE HCL 10 MG PO TABS: 10.0000 mg | ORAL_TABLET | Freq: Two times a day (BID) | ORAL | 1 refills | Status: DC | PRN

## 2023-06-16 NOTE — Progress Notes (Signed)
    SUBJECTIVE:   CHIEF COMPLAINT / HPI:   GAD Started on Escitalopram  10mg  daily and Hydroxyzine  10mg  prn at last visit on 03/09/2023.  Feels it is working well for her, occasionally takes 2 pills of the hydroxyzine .  Would not like to change her regimen at this time.  PERTINENT  PMH / PSH: GAD, prediabetes  OBJECTIVE:   BP (!) 123/90   Pulse 79   Ht 5\' 3"  (1.6 m)   Wt 193 lb 9.6 oz (87.8 kg)   BMI 34.29 kg/m    General: Alert, no apparent distress, well groomed HEENT: Normocephalic, atraumatic, moist mucus membranes, neck supple Respiratory: Normal respiratory effort GI: Non-distended Skin: No rashes, no jaundice Psych: Appropriate mood and affect  ASSESSMENT/PLAN:   Assessment & Plan GAD (generalized anxiety disorder) Mood significantly improved on escitalopram  10 mg daily and hydroxyzine .  Occasionally takes 2 pills of the hydroxyzine , will increase dose to 10 mg twice daily as needed. Encounter for contraceptive management, unspecified type Urine pregnancy test negative, received Depo today.  Return between 7/15 and 7/29 for repeat injection. Encounter for screening mammogram for breast cancer Mammogram ordered, provided information to schedule to breast center Elevated blood pressure reading 123/90 upon repeat, home readings appropriate.  No indication for medication at this time and will continue to monitor.  Encouraged continued lifestyle management.   Dr. Jonne Netters, DO [facility]

## 2023-06-16 NOTE — Patient Instructions (Signed)
 It was wonderful to see you today! Thank you for choosing [facility].   Please bring ALL of your medications with you to every visit.   Today we talked about:  You received your Depo shot today, please return between 7/15 and 7/29. I am glad the medication is working for your mood!  You can take the hydroxyzine  2 pills at a time if needed and continue the escitalopram .  If you have any further concerns about your mood please let me know Continue to focus on healthy diet with good protein and fruit and vegetables and reduce the amount of sugar you are eating.  This will help keep your A1c down and prevent development of diabetes in the future. You are due for a mammogram to screen for breast cancer.  I ordered the mammogram, please call the Breast Center using the information provided below to schedule:  [facility] [address] [place],  [place]  [address] [phone]   Please follow up in 6 months for medical check up and 3 months for Depo  Call the clinic at [phone] if your symptoms worsen or you have any concerns.  Please be sure to schedule follow up at the front desk before you leave today.   Jonne Netters, DO Family Medicine

## 2023-06-23 ENCOUNTER — Ambulatory Visit
Admission: RE | Admit: 2023-06-23 | Discharge: 2023-06-23 | Disposition: A | Discharge status: Home / Self Care | Source: Ambulatory Visit | Attending: Family Medicine | Admitting: Family Medicine

## 2023-06-23 DIAGNOSIS — Z1231 Encounter for screening mammogram for malignant neoplasm of breast: Secondary | ICD-10-CM

## 2023-06-26 ENCOUNTER — Encounter: Payer: Self-pay | Admitting: Family Medicine

## 2023-07-28 ENCOUNTER — Encounter: Payer: Self-pay | Admitting: *Deleted

## 2023-08-17 ENCOUNTER — Encounter: Payer: Self-pay | Admitting: Family Medicine

## 2023-08-17 ENCOUNTER — Other Ambulatory Visit (HOSPITAL_COMMUNITY)
Admission: RE | Admit: 2023-08-17 | Discharge: 2023-08-17 | Disposition: A | Discharge status: Home / Self Care | Source: Ambulatory Visit | Attending: Family Medicine | Admitting: Family Medicine

## 2023-08-17 ENCOUNTER — Ambulatory Visit (INDEPENDENT_AMBULATORY_CARE_PROVIDER_SITE_OTHER): Admitting: Family Medicine

## 2023-08-17 VITALS — BP 116/82 | HR 92 | Wt 193.0 lb

## 2023-08-17 DIAGNOSIS — N898 Other specified noninflammatory disorders of vagina: Secondary | ICD-10-CM

## 2023-08-17 DIAGNOSIS — L98491 Non-pressure chronic ulcer of skin of other sites limited to breakdown of skin: Secondary | ICD-10-CM | POA: Diagnosis not present

## 2023-08-17 DIAGNOSIS — Z113 Encounter for screening for infections with a predominantly sexual mode of transmission: Secondary | ICD-10-CM | POA: Diagnosis not present

## 2023-08-17 DIAGNOSIS — L819 Disorder of pigmentation, unspecified: Secondary | ICD-10-CM

## 2023-08-17 MED ORDER — CEPHALEXIN 500 MG PO CAPS: 500.0000 mg | ORAL_CAPSULE | Freq: Two times a day (BID) | ORAL | 0 refills | Status: DC

## 2023-08-17 NOTE — Progress Notes (Signed)
    SUBJECTIVE:   CHIEF COMPLAINT / HPI:   Skin irritation  Patient reports skin irritation and drainage in the gluteal cleft. Says about one week ago the area was itchy; however, starting Thursday patient started noticing more discomfort and greenish discharge from the area. She started putting tissue paper to absorb the discharge. Endorses fever of up to 103 two days ago, but she took tylenol  and ibuprofen  for this. Denies fever since then.  Has never had other areas of skin ulceration. Reports history of a boil on her back that required packing about 9 years ago.   Vaginal irritation   Patient reports frequent BV. Says she often goes to urgent care but was told that she would have to come to PCP for further BV concerns given how frequent it was. Reports 3 episodes this year. Has tried boric acid suppositories. Does not notice a correlation with intercourse and BV occurrence. Reports she has recently been having more irritation and discharge for about a week.   Rash on face   Reports rash on her face for the last 8 months that has led to receding hair line. She once asked urgent care regarding the rash and was told it might be psoriasis. Has not had a biopsy or taken medication for the rash. States family history of rheumatoid arthritis in her aunt and arthritis unspecified in her father. No other history of autoimmune disease. No weight loss, respiratory illness, or other mucosal involvement. Mentions occasional ankle swelling and soreness, no other arthralgias.   PERTINENT  PMH / PSH: None pertinent   OBJECTIVE:   BP 116/82   Pulse 92   Wt 193 lb (87.5 kg)   SpO2 98%   BMI 34.19 kg/m   General: well appearing, in no acute distress CV: well perfused  Resp: Normal work of breathing on room air Abd: Soft, non tender, non distended  GU: (chaperoned by CMA) some thin discharge present in the vagina, no CMT or friability  Derm:  Gluteal cleft with two ~2 inch ulcerated lesions on either  side of gluteal cleft, no surrounding erythema, induration, or fluctuance. Some serous drainage present clear-green  Face: Diffuse hypopigmented macules with slight scaling across forehead and on bilateral temples. Some receding of hairline temporally with scaly lesions around the temples.  Picture in media tab.    ASSESSMENT/PLAN:   Assessment & Plan Skin ulcer, limited to breakdown of skin (HCC) Concern for infection given ulceration and drainage. Could be secondary infection atop irritation and skin breakdown from wetness in the area. Also concern for a possible autoimmune or rheumatologic process given hypopigmented macules on face as well. No other history of immunocompromise.  - keflex  7 days  - Desitin barrier cream and gauze pad to keep area dry   Vaginal irritation Given history of frequent BV, could be BV. Can consider postcoital abx if frequency becomes recurrent. However, would like to avoid if possible.  - Wet prep, GC/CT, trich  Hypopigmented skin lesion Differential includes tinea capitis, psoriasis, pityriasis alba given hypopigmentation, scale, and some hair loss. Concern for rheumatologic process given above skin ulceration as well. Low likelihood sarcoid or lupus given history and presentation.  - ANA, CCP, Dsdna, Rf  - Referral to dermatology  - Referral to [facility] derm clinic for KOH scraping and further evaluation    Follow in one month or sooner if condition worsens   [name], MD [facility]

## 2023-08-17 NOTE — Patient Instructions (Signed)
 It was wonderful to see you today.  Please bring ALL of your medications with you to every visit.   Today we talked about:  Ulcers - I believe you may have a skin infection causing ulceration. I wonder if there is an underlying autoimmune disorder causing you to be more susceptible to this.   I am sending 7 days of antibiotics and will do some blood tests. I also am putting in a referral to the dermatologist   Vaginal irritation - I will let you know the results of the tests.   Please follow up in 1 month   Thank you for choosing [facility].   Please call [phone] with any questions about today's appointment.  Please be sure to schedule follow up at the front desk before you leave today.   [name], MD  Family Medicine

## 2023-08-24 ENCOUNTER — Other Ambulatory Visit: Payer: Self-pay

## 2023-08-24 ENCOUNTER — Telehealth: Payer: Self-pay | Admitting: Family Medicine

## 2023-08-24 DIAGNOSIS — L819 Disorder of pigmentation, unspecified: Secondary | ICD-10-CM

## 2023-08-24 DIAGNOSIS — L98491 Non-pressure chronic ulcer of skin of other sites limited to breakdown of skin: Secondary | ICD-10-CM

## 2023-08-24 DIAGNOSIS — Z113 Encounter for screening for infections with a predominantly sexual mode of transmission: Secondary | ICD-10-CM

## 2023-08-24 NOTE — Telephone Encounter (Signed)
 Patient came in stating that we recently placed a referral for dermatology to [facility], but they do not have any availability until Feb. Wants to know if we can send the referral somewhere else please.

## 2023-08-25 ENCOUNTER — Telehealth: Payer: Self-pay | Admitting: Family Medicine

## 2023-08-25 NOTE — Telephone Encounter (Signed)
 Called to scheduled dermatology appointment LVM to call back.   Appointment Notes: hypopigmented macules on her face   Any question please let me know.   Thanks!

## 2023-08-27 ENCOUNTER — Ambulatory Visit: Payer: Self-pay | Admitting: Family Medicine

## 2023-08-27 DIAGNOSIS — N898 Other specified noninflammatory disorders of vagina: Secondary | ICD-10-CM

## 2023-08-28 LAB — CERVICOVAGINAL ANCILLARY ONLY
Bacterial Vaginitis (gardnerella): POSITIVE — AB
Chlamydia: NEGATIVE
Comment: NEGATIVE
Comment: NEGATIVE
Comment: NORMAL
Neisseria Gonorrhea: NEGATIVE

## 2023-08-29 LAB — HIV ANTIBODY (ROUTINE TESTING W REFLEX): HIV Screen 4th Generation wRfx: NONREACTIVE

## 2023-08-29 LAB — SJOGREN'S SYNDROME ANTIBODS(SSA + SSB)
ENA SSA (RO) Ab: <0.2 AI (ref 0.0–0.9)
ENA SSB (LA) Ab: <0.2 AI (ref 0.0–0.9)

## 2023-08-29 LAB — ANTI-DNA ANTIBODY, DOUBLE-STRANDED: dsDNA Ab: <1 [IU]/mL (ref 0–9)

## 2023-08-29 LAB — ANTI-CCP AB, IGG + IGA (RDL): Anti-CCP Ab, IgG + IgA (RDL): <20 U (ref <20)

## 2023-08-29 LAB — ANA: Anti Nuclear Antibody (ANA): NEGATIVE

## 2023-08-29 LAB — RHEUMATOID FACTOR: Rheumatoid fact SerPl-aCnc: <10 [IU]/mL (ref <14.0)

## 2023-08-31 MED ORDER — METRONIDAZOLE 500 MG PO TABS: 500.0000 mg | ORAL_TABLET | Freq: Two times a day (BID) | ORAL | 0 refills | Status: DC

## 2023-08-31 MED ORDER — FLUCONAZOLE 150 MG PO TABS: 150.0000 mg | ORAL_TABLET | Freq: Once | ORAL | 0 refills | Status: AC

## 2023-08-31 NOTE — Telephone Encounter (Signed)
 Called patient to discuss lab results.  Sent flagyl  for BV and sent diflucan  at request of patient since she frequently gets yeast after being treated for BV.

## 2023-08-31 NOTE — Addendum Note (Signed)
 Addended by: Jeston Junkins on: 08/31/2023 02:04 PM   Modules accepted: Orders

## 2023-09-15 ENCOUNTER — Ambulatory Visit (INDEPENDENT_AMBULATORY_CARE_PROVIDER_SITE_OTHER)

## 2023-09-15 DIAGNOSIS — Z3042 Encounter for surveillance of injectable contraceptive: Secondary | ICD-10-CM

## 2023-09-15 MED ORDER — MEDROXYPROGESTERONE ACETATE 150 MG/ML IM SUSP
150.0000 mg | Freq: Once | INTRAMUSCULAR | Status: AC
  Administered 2023-09-15: 150 mg via INTRAMUSCULAR

## 2023-09-15 NOTE — Progress Notes (Signed)
 Patient here today for Depo Provera  injection and is within her dates.    Last contraceptive appt was 06/16/2023  Depo given in RUOQ today.  Site unremarkable & patient tolerated injection.    Next injection due 12/01/23-12/15/23.  Reminder card given.    Chiquita JAYSON English, RN

## 2023-09-24 ENCOUNTER — Ambulatory Visit: Admitting: Family Medicine

## 2023-09-24 VITALS — BP 120/86 | HR 84 | Ht 63.0 in | Wt 197.4 lb

## 2023-09-24 DIAGNOSIS — L853 Xerosis cutis: Secondary | ICD-10-CM | POA: Diagnosis not present

## 2023-09-24 DIAGNOSIS — L210 Seborrhea capitis: Secondary | ICD-10-CM | POA: Diagnosis not present

## 2023-09-24 DIAGNOSIS — F411 Generalized anxiety disorder: Secondary | ICD-10-CM | POA: Diagnosis not present

## 2023-09-24 MED ORDER — CETIRIZINE HCL 10 MG PO TABS: 10.0000 mg | ORAL_TABLET | Freq: Every day | ORAL | 11 refills | Status: AC

## 2023-09-24 MED ORDER — HYDROXYZINE HCL 10 MG PO TABS: 10.0000 mg | ORAL_TABLET | Freq: Two times a day (BID) | ORAL | 1 refills | Status: AC | PRN

## 2023-09-24 MED ORDER — KETOCONAZOLE 2 % EX SHAM: 1.0000 | MEDICATED_SHAMPOO | CUTANEOUS | 0 refills | Status: DC

## 2023-09-24 NOTE — Patient Instructions (Addendum)
 It was so good to see you today! Thank you for allowing me to take care of you.  Today we discussed the following concerns and plans:  Scalp irritation - try to go without wigs or other artificial hair products for at least 2 weeks to let your skin and scalp bleed - use Nizoral  shampoo every 3 days to wash your hair - do not use tea tree oil as this can be drying; you could try coconut oil or shea butter instead.  Itching - continue atarax  as needed - stop using alcohol on your skin; it is important to keep your skin hydrated by using lotions such as Sarna lotion - use Vaseline on areas that itch as well - DO NOT SCRATCH! - start taking cetirizine  (zyrtec ) 10 mg daily; you can buy this over the counter.  If you have any concerns, please call the clinic or schedule an appointment.  It was a pleasure to take care of you today. Be well!  Lauraine Norse, DO [facility], PGY-2  Do you need your medications delivered to your home?   We'll send your prescription to the [facility] for delivery.          Address: [address] [place], [place], [place] [address]          Phone: [phone]  Please call the [facility] to speak with a pharmacist and set up your home medication delivery. If you have any questions, feel free to contact us  -- we're happy to help!  Other [facility] that offer affordable prices on both prescriptions and over-the-counter items, as well as convenient services like vaccinations, are  [facility], at [facility]         Address:  [address], [place], [place] [address]         Phone: [phone]  [facility], located in the [facility]        Address: [address], [place], [place] [address]        Phone: [phone]  [facility], at [facility]       Address: [address], [place], [place] [address]        Phone: [phone]  [facility], at [facility]       Address: [address], [place], [place] [address]       Phone: [phone]

## 2023-09-24 NOTE — Progress Notes (Signed)
    SUBJECTIVE:   CHIEF COMPLAINT / HPI:   Face lesions Started on scalp, itching and bleeding when she scratches. First noticed about 8 months ago. Thought it was psoriasis, so she tried OTC options including Aveeno, selsun blue, all the eczema stuff. She uses Dove soap. She puts tea tree oil on her face to hydrate skin. Washes body daily, hair every other week.  Has very dry skin at baseline, becomes very itchy if she does not use lotion. Also notes that applying rubbing alcohol to her skin helps stop the itch so she applies this everywhere except face/scalp. Has noticed darker patches on arms, chest, and these are very itchy.  PERTINENT  PMH / PSH: Reviewed.  OBJECTIVE:   BP 120/86   Pulse 84   Ht 5' 3 (1.6 m)   Wt 197 lb 6.4 oz (89.5 kg)   SpO2 100%   BMI 34.97 kg/m   General: well-appearing, no acute distress. HEENT: Scalp with significant scaly buildup, particularly in the front and sides of scalp. Wearing wig with glue. Pulm: No increased work of breathing. Extremities: no peripheral edema. Moves all extremities equally. Skin: Forehead with scattered hypopigmented flat lesions following the curvature of the hairline. Scattered hyperpigmented lesions across torso and limbs. Neuro: Alert and oriented x3, speech normal in content, no facial asymmetry. Psych:  Cognition and judgment appear intact.   ASSESSMENT/PLAN:   Assessment & Plan Seborrhea capitis Suspect buildup on scalp related to wig use and/or irritation from wig glue. - pt will avoid using wigs or artificial hair products for at least 2 weeks - Nizoral  shampoo every 3 days - avoid tea tree oil, especially on the face, as this can be very drying; recommend coconut oil, etc instead. Dry skin dermatitis Suspect patient's use of rubbing alcohol is worsening the dry skin. - take cetirizine  10 mg daily - pt already prescribed atarax  for anxiety; this may benefit her itching as well - recommended hydrating  agents like Sarna lotion, vaseline, etc. - avoid scratching   Lauraine Norse, DO [facility]

## 2023-10-21 ENCOUNTER — Other Ambulatory Visit: Payer: Self-pay | Admitting: Family Medicine

## 2023-12-15 ENCOUNTER — Ambulatory Visit (INDEPENDENT_AMBULATORY_CARE_PROVIDER_SITE_OTHER)

## 2023-12-15 DIAGNOSIS — Z3042 Encounter for surveillance of injectable contraceptive: Secondary | ICD-10-CM | POA: Diagnosis not present

## 2023-12-15 MED ORDER — MEDROXYPROGESTERONE ACETATE 150 MG/ML IM SUSP
150.0000 mg | Freq: Once | INTRAMUSCULAR | Status: AC
  Administered 2023-12-15: 150 mg via INTRAMUSCULAR

## 2023-12-15 NOTE — Progress Notes (Signed)
 Patient here today for Depo Provera  injection and is within her dates.    Last contraceptive appt was 06/16/2023  Depo given in LUOQ today.  Site unremarkable & patient tolerated injection.    Next injection due 03/01/2024-03/15/2024.  Reminder card given.    Chiquita JAYSON English, RN

## 2023-12-28 ENCOUNTER — Ambulatory Visit: Payer: Self-pay | Admitting: Family Medicine

## 2023-12-28 ENCOUNTER — Ambulatory Visit (INDEPENDENT_AMBULATORY_CARE_PROVIDER_SITE_OTHER): Admitting: Family Medicine

## 2023-12-28 ENCOUNTER — Encounter: Payer: Self-pay | Admitting: Family Medicine

## 2023-12-28 ENCOUNTER — Other Ambulatory Visit (HOSPITAL_COMMUNITY)
Admission: RE | Admit: 2023-12-28 | Discharge: 2023-12-28 | Disposition: A | Discharge status: Home / Self Care | Source: Ambulatory Visit | Attending: Family Medicine | Admitting: Family Medicine

## 2023-12-28 VITALS — BP 100/60 | HR 88 | Ht 63.0 in | Wt 206.5 lb

## 2023-12-28 DIAGNOSIS — R238 Other skin changes: Secondary | ICD-10-CM | POA: Diagnosis not present

## 2023-12-28 DIAGNOSIS — N898 Other specified noninflammatory disorders of vagina: Secondary | ICD-10-CM | POA: Insufficient documentation

## 2023-12-28 DIAGNOSIS — N9089 Other specified noninflammatory disorders of vulva and perineum: Secondary | ICD-10-CM

## 2023-12-28 LAB — POCT WET PREP (WET MOUNT)
Clue Cells Wet Prep Whiff POC: NEGATIVE
Trichomonas Wet Prep HPF POC: ABSENT
WBC, Wet Prep HPF POC: >20

## 2023-12-28 MED ORDER — ZINC OXIDE 13 % EX CREA: TOPICAL_OINTMENT | Freq: Every day | CUTANEOUS | 0 refills | Status: AC | PRN

## 2023-12-28 NOTE — Patient Instructions (Signed)
 It was wonderful to see you today.  Please bring ALL of your medications with you to every visit.   Today we talked about:  Vaginal irritation - do not use anything internally that is not prescribed. I will let you know what the results show.   For your abdominal breakdown I am going to prescribe a antifungal location. This can happen with yeast infections or increased sweating in areas of the body that have creasing.   Follow up in one month   Thank you for choosing [facility].   Please call [phone] with any questions about today's appointment.  Please be sure to schedule follow up at the front desk before you leave today.   [name], MD  Family Medicine

## 2023-12-28 NOTE — Progress Notes (Signed)
   SUBJECTIVE:   CHIEF COMPLAINT / HPI:  Discussed the use of AI scribe software for clinical note transcription with the patient, who gave verbal consent to proceed.  History of Present Illness Julie Hawkins is a 43 year old female with a history of recurrent bacterial vaginosis who presents with vaginal discharge and irritation.  Vaginal discharge and pruritus - Vaginal discharge present for two weeks - Discharge appears green when using boric acid suppositories, which are clear - Significant vaginal and perineal itching, extending to the buttock area - No vaginal pain - Sexually active and urinates after intercourse - No sexually transmitted infections this year - No use of internal lotions or soaps; uses Dove soap externally - History of recurrent bacterial vaginosis - Discontinued boric acid suppositories recently due to worsening symptoms - Used boric acid suppositories once or twice a month for years - Use of over-the-counter vaginal creams for itching, which may have worsened symptoms  Abdominal rash - Rash present on the stomach, currently second occurrence - Rash is recurrent and possibly related to scratching - Considers the rash could be due to skin breakdown from sweat or yeast    PERTINENT  PMH / PSH: H/o trichomonas   OBJECTIVE:  BP 100/60   Pulse 88   Ht 5' 3 (1.6 m)   Wt 206 lb 8 oz (93.7 kg)   SpO2 100%   BMI 36.58 kg/m   Physical Exam GENITOURINARY: Cervix normal. SKIN: Skin irritation due to shaving. Skin breakdown from sweat or yeast.  General: well appearing, in no acute distress Resp: Normal work of breathing on room air Abd: Soft, non tender, non distended, intertrigous breakdown with mild erythema  Neuro: Alert & Oriented  GU (chaperoned by CMA tashira): minimal thin discharge present, no CMT, vulvar area slightly sclerotic with some hyperpigmentation   ASSESSMENT/PLAN:   Assessment & Plan Vaginal discharge Symptoms may be  exacerbated by boric acid and over-the-counter creams. Awaiting test results for further evaluation. - Discontinued boric acid use. - Avoid over-the-counter vaginal creams. - Use water for external cleansing. - Await test results. Skin breakdown Rash possibly due to sweat or yeast infection, exacerbated by scratching. - Prescribed nystatin  cream. - Apply Desitin barrier cream. Vulvar irritation Likely due to moisture or irritation from shaving. However, should consider lichen slerosis if worsens.  - desitin cream recommended and break from shaving.     Julie Saliva, MD [facility]

## 2023-12-30 LAB — CERVICOVAGINAL ANCILLARY ONLY
Chlamydia: NEGATIVE
Comment: NEGATIVE
Comment: NEGATIVE
Comment: NORMAL
Neisseria Gonorrhea: NEGATIVE
Trichomonas: NEGATIVE

## 2024-03-03 ENCOUNTER — Ambulatory Visit: Admitting: Student

## 2024-03-03 ENCOUNTER — Other Ambulatory Visit (HOSPITAL_COMMUNITY)
Admission: RE | Admit: 2024-03-03 | Discharge: 2024-03-03 | Disposition: A | Source: Ambulatory Visit | Attending: Family Medicine | Admitting: Family Medicine

## 2024-03-03 ENCOUNTER — Ambulatory Visit (INDEPENDENT_AMBULATORY_CARE_PROVIDER_SITE_OTHER)

## 2024-03-03 ENCOUNTER — Encounter: Payer: Self-pay | Admitting: Student

## 2024-03-03 VITALS — BP 133/97 | HR 80 | Ht 63.0 in | Wt 200.8 lb

## 2024-03-03 DIAGNOSIS — N949 Unspecified condition associated with female genital organs and menstrual cycle: Secondary | ICD-10-CM | POA: Diagnosis present

## 2024-03-03 DIAGNOSIS — Z3042 Encounter for surveillance of injectable contraceptive: Secondary | ICD-10-CM

## 2024-03-03 LAB — POCT WET PREP (WET MOUNT)
Clue Cells Wet Prep Whiff POC: NEGATIVE
Trichomonas Wet Prep HPF POC: ABSENT

## 2024-03-03 LAB — POCT URINALYSIS DIP (MANUAL ENTRY)
Bilirubin, UA: NEGATIVE
Blood, UA: NEGATIVE
Glucose, UA: NEGATIVE mg/dL
Ketones, POC UA: NEGATIVE mg/dL
Nitrite, UA: NEGATIVE
Protein Ur, POC: NEGATIVE mg/dL
Spec Grav, UA: 1.02
Urobilinogen, UA: 1 U/dL
pH, UA: 7

## 2024-03-03 LAB — POCT UA - MICROSCOPIC ONLY: RBC, Urine, Miroscopic: NONE SEEN

## 2024-03-03 MED ORDER — FLUCONAZOLE 150 MG PO TABS: 150.0000 mg | ORAL_TABLET | Freq: Once | ORAL | 0 refills | Status: AC

## 2024-03-03 MED ORDER — MEDROXYPROGESTERONE ACETATE 150 MG/ML IM SUSP
150.0000 mg | Freq: Once | INTRAMUSCULAR | Status: AC
  Administered 2024-03-03: 150 mg via INTRAMUSCULAR

## 2024-03-03 NOTE — Progress Notes (Addendum)
" ° ° °  SUBJECTIVE:   CHIEF COMPLAINT / HPI:   Vaginal discomfort: Patient is a 44 y.o. female presenting with vaginal discomfort for 7 days days.  Reports dysuria, foul odor, minimal discharge.  Sexually active, interested in screening for STIs.  Does not want the blood work.  Her contraception is Depo-Provera . She does not use barrier method consistently.  OBJECTIVE:   Ht 5' 3 (1.6 m)   Wt 200 lb 12.8 oz (91.1 kg)   BMI 35.57 kg/m    General: NAD, pleasant, able to participate in exam Respiratory: Normal effort, no obvious respiratory distress Pelvic: VULVA: normal appearing vulva with no masses, excoriations of labia majora left side (patient reports scratching that area 2/2 itching), VAGINA: Normal appearing vagina with normal color, no lesions, with discharge present CERVIX: No lesions, scant and white discharge present  Chaperone Alexis CMA present for pelvic exam  ASSESSMENT/PLAN:   Assessment & Plan Vaginal discomfort -UA/wet mount unremarkable in office today - Symptoms consistent with Candida vulvovaginitis, will trial Diflucan  - Follow-up STI testing - If symptoms worsen or fail to improve over the next week, return to care  Follow-up recommendations Elevated blood pressure today, recommend follow-up with PCP in 2 weeks or sooner  Gladis Church, DO [facility]   "

## 2024-03-03 NOTE — Patient Instructions (Addendum)
 It was great to see you! Thank you for allowing me to participate in your care!   I recommend that you always bring your medications to each appointment as this makes it easy to ensure we are on the correct medications and helps us  not miss when refills are needed.  Our plans for today:  -We will follow-up the STI testing -Trial Difluca, 1 tablet - If not better in 1 week please return to care   Take care and seek immediate care sooner if you develop any concerns. Please remember to show up 15 minutes before your scheduled appointment time!  Gladis Church, DO [facility]

## 2024-03-03 NOTE — Progress Notes (Signed)
 Patient here today for Depo Provera  injection and is within her dates.    Last contraceptive appt was 06/16/23. At beginning of nurse visit, patient expressed concerns with vaginal itching/discomfort, burning with urination. Advised that I would not be able to diagnose/prescribe treat concern and that visit with provider would be needed. Same day appt scheduled with Dr. Howell. See separate encounter.   At the end of provider visit, administered depo. Depo given in RUOQ today.  Site unremarkable & patient tolerated injection.    Next injection due 05/19/24-06/02/24.  Reminder card given.    Chiquita JAYSON English, RN

## 2024-03-04 ENCOUNTER — Ambulatory Visit: Payer: Self-pay | Admitting: Student

## 2024-03-04 LAB — CERVICOVAGINAL ANCILLARY ONLY
Chlamydia: NEGATIVE
Comment: NEGATIVE
Comment: NEGATIVE
Comment: NORMAL
Neisseria Gonorrhea: NEGATIVE
Trichomonas: NEGATIVE

## 2024-03-30 ENCOUNTER — Ambulatory Visit: Admitting: Dermatology
